# Patient Record
Sex: Female | Born: 1935 | Race: White | Hispanic: No | Smoking: Former smoker
Health system: Southern US, Community
[De-identification: ages and names within clinical notes are randomized; demographics above are authoritative.]

## PROBLEM LIST (undated history)

## (undated) DIAGNOSIS — H547 Unspecified visual loss: Secondary | ICD-10-CM

## (undated) DIAGNOSIS — F32A Depression, unspecified: Secondary | ICD-10-CM

## (undated) DIAGNOSIS — H269 Unspecified cataract: Secondary | ICD-10-CM

## (undated) DIAGNOSIS — F419 Anxiety disorder, unspecified: Secondary | ICD-10-CM

## (undated) DIAGNOSIS — I10 Essential (primary) hypertension: Secondary | ICD-10-CM

## (undated) DIAGNOSIS — J449 Chronic obstructive pulmonary disease, unspecified: Secondary | ICD-10-CM

## (undated) DIAGNOSIS — E785 Hyperlipidemia, unspecified: Secondary | ICD-10-CM

## (undated) DIAGNOSIS — J45909 Unspecified asthma, uncomplicated: Secondary | ICD-10-CM

## (undated) DIAGNOSIS — F0390 Unspecified dementia without behavioral disturbance: Secondary | ICD-10-CM

## (undated) DIAGNOSIS — F329 Major depressive disorder, single episode, unspecified: Secondary | ICD-10-CM

## (undated) DIAGNOSIS — E079 Disorder of thyroid, unspecified: Secondary | ICD-10-CM

## (undated) HISTORY — DX: Unspecified visual loss: H54.7

## (undated) HISTORY — DX: Anxiety disorder, unspecified: F41.9

## (undated) HISTORY — DX: Disorder of thyroid, unspecified: E07.9

## (undated) HISTORY — DX: Unspecified asthma, uncomplicated: J45.909

## (undated) HISTORY — DX: Unspecified cataract: H26.9

## (undated) HISTORY — DX: Major depressive disorder, single episode, unspecified: F32.9

## (undated) HISTORY — DX: Depression, unspecified: F32.A

## (undated) HISTORY — PX: BACK SURGERY: SHX140

---

## 2003-12-21 ENCOUNTER — Ambulatory Visit: Payer: Self-pay

## 2005-01-01 ENCOUNTER — Ambulatory Visit: Payer: Self-pay

## 2006-02-20 ENCOUNTER — Ambulatory Visit: Payer: Self-pay | Admitting: Family Medicine

## 2006-06-06 ENCOUNTER — Ambulatory Visit: Payer: Self-pay | Admitting: General Surgery

## 2007-08-12 ENCOUNTER — Ambulatory Visit: Payer: Self-pay | Admitting: Family Medicine

## 2011-04-30 ENCOUNTER — Ambulatory Visit: Payer: Self-pay | Admitting: Ophthalmology

## 2011-05-07 ENCOUNTER — Ambulatory Visit: Payer: Self-pay | Admitting: Ophthalmology

## 2012-09-29 ENCOUNTER — Ambulatory Visit: Payer: Self-pay | Admitting: Ophthalmology

## 2012-10-06 ENCOUNTER — Ambulatory Visit: Payer: Self-pay | Admitting: Ophthalmology

## 2012-11-03 ENCOUNTER — Ambulatory Visit: Payer: Self-pay

## 2012-12-16 ENCOUNTER — Emergency Department: Payer: Self-pay | Admitting: Emergency Medicine

## 2012-12-16 ENCOUNTER — Ambulatory Visit: Payer: Self-pay | Admitting: Family Medicine

## 2012-12-16 LAB — PROTIME-INR
INR: 1
Prothrombin Time: 13.4 secs (ref 11.5–14.7)

## 2012-12-16 LAB — URINALYSIS, COMPLETE
Bacteria: NONE SEEN
Bilirubin,UR: NEGATIVE
Blood: NEGATIVE
Nitrite: NEGATIVE
RBC,UR: <1 /HPF (ref 0–5)
Squamous Epithelial: NONE SEEN
WBC UR: NONE SEEN /HPF (ref 0–5)

## 2012-12-16 LAB — COMPREHENSIVE METABOLIC PANEL
Albumin: 3.6 g/dL (ref 3.4–5.0)
Alkaline Phosphatase: 85 U/L (ref 50–136)
Anion Gap: 5 — ABNORMAL LOW (ref 7–16)
BUN: 10 mg/dL (ref 7–18)
Bilirubin,Total: 0.4 mg/dL (ref 0.2–1.0)
Chloride: 109 mmol/L — ABNORMAL HIGH (ref 98–107)
Co2: 27 mmol/L (ref 21–32)
Creatinine: 0.8 mg/dL (ref 0.60–1.30)
EGFR (African American): >60
EGFR (Non-African Amer.): >60
Glucose: 104 mg/dL — ABNORMAL HIGH (ref 65–99)
Potassium: 3.3 mmol/L — ABNORMAL LOW (ref 3.5–5.1)
SGOT(AST): 30 U/L (ref 15–37)
Total Protein: 6.8 g/dL (ref 6.4–8.2)

## 2012-12-16 LAB — CBC
HCT: 38.5 % (ref 35.0–47.0)
MCH: 30.2 pg (ref 26.0–34.0)
MCV: 90 fL (ref 80–100)
Platelet: 192 10*3/uL (ref 150–440)
RDW: 13.8 % (ref 11.5–14.5)

## 2012-12-16 LAB — APTT: Activated PTT: 28.3 secs (ref 23.6–35.9)

## 2012-12-16 LAB — CALCIUM: Calcium, Total: 8.9 mg/dL (ref 8.5–10.1)

## 2012-12-16 LAB — MAGNESIUM: Magnesium: 1.9 mg/dL

## 2013-01-20 ENCOUNTER — Ambulatory Visit: Payer: Self-pay | Admitting: Unknown Physician Specialty

## 2013-02-22 ENCOUNTER — Ambulatory Visit: Payer: Self-pay

## 2013-03-14 ENCOUNTER — Emergency Department: Payer: Self-pay | Admitting: Emergency Medicine

## 2013-03-14 LAB — BASIC METABOLIC PANEL
ANION GAP: 3 — AB (ref 7–16)
BUN: 12 mg/dL (ref 7–18)
CHLORIDE: 108 mmol/L — AB (ref 98–107)
CREATININE: 0.81 mg/dL (ref 0.60–1.30)
Calcium, Total: 8.8 mg/dL (ref 8.5–10.1)
Co2: 29 mmol/L (ref 21–32)
EGFR (African American): >60
EGFR (Non-African Amer.): >60
Glucose: 128 mg/dL — ABNORMAL HIGH (ref 65–99)
Osmolality: 281 (ref 275–301)
Potassium: 3.9 mmol/L (ref 3.5–5.1)
Sodium: 140 mmol/L (ref 136–145)

## 2013-03-14 LAB — CBC
HCT: 39 % (ref 35.0–47.0)
HGB: 13 g/dL (ref 12.0–16.0)
MCH: 32.1 pg (ref 26.0–34.0)
MCHC: 33.3 g/dL (ref 32.0–36.0)
MCV: 96 fL (ref 80–100)
Platelet: 185 10*3/uL (ref 150–440)
RBC: 4.06 10*6/uL (ref 3.80–5.20)
RDW: 13.8 % (ref 11.5–14.5)
WBC: 10.7 10*3/uL (ref 3.6–11.0)

## 2013-03-14 LAB — TROPONIN I: Troponin-I: <0.02 ng/mL

## 2013-04-07 ENCOUNTER — Inpatient Hospital Stay: Payer: Self-pay | Admitting: Internal Medicine

## 2013-04-07 LAB — CK TOTAL AND CKMB (NOT AT ARMC)
CK, TOTAL: 225 U/L — AB
CK-MB: 3.3 ng/mL (ref 0.5–3.6)

## 2013-04-07 LAB — COMPREHENSIVE METABOLIC PANEL
ALT: 28 U/L (ref 12–78)
Albumin: 3.9 g/dL (ref 3.4–5.0)
Alkaline Phosphatase: 91 U/L
Anion Gap: 4 — ABNORMAL LOW (ref 7–16)
BUN: 8 mg/dL (ref 7–18)
Bilirubin,Total: 0.4 mg/dL (ref 0.2–1.0)
CALCIUM: 9.2 mg/dL (ref 8.5–10.1)
CHLORIDE: 105 mmol/L (ref 98–107)
Co2: 28 mmol/L (ref 21–32)
Creatinine: 0.94 mg/dL (ref 0.60–1.30)
GFR CALC NON AF AMER: 56 — AB
Glucose: 116 mg/dL — ABNORMAL HIGH (ref 65–99)
OSMOLALITY: 273 (ref 275–301)
Potassium: 3.9 mmol/L (ref 3.5–5.1)
SGOT(AST): 39 U/L — ABNORMAL HIGH (ref 15–37)
Sodium: 137 mmol/L (ref 136–145)
TOTAL PROTEIN: 7.4 g/dL (ref 6.4–8.2)

## 2013-04-07 LAB — TROPONIN I

## 2013-04-07 LAB — CBC
HCT: 39 % (ref 35.0–47.0)
HGB: 13.2 g/dL (ref 12.0–16.0)
MCH: 32.9 pg (ref 26.0–34.0)
MCHC: 33.7 g/dL (ref 32.0–36.0)
MCV: 98 fL (ref 80–100)
Platelet: 228 10*3/uL (ref 150–440)
RBC: 4 10*6/uL (ref 3.80–5.20)
RDW: 13.2 % (ref 11.5–14.5)
WBC: 11.4 10*3/uL — ABNORMAL HIGH (ref 3.6–11.0)

## 2013-04-08 LAB — BASIC METABOLIC PANEL
ANION GAP: 6 — AB (ref 7–16)
BUN: 10 mg/dL (ref 7–18)
CALCIUM: 8.6 mg/dL (ref 8.5–10.1)
CO2: 27 mmol/L (ref 21–32)
CREATININE: 0.98 mg/dL (ref 0.60–1.30)
Chloride: 105 mmol/L (ref 98–107)
EGFR (Non-African Amer.): 53 — ABNORMAL LOW
Glucose: 184 mg/dL — ABNORMAL HIGH (ref 65–99)
OSMOLALITY: 279 (ref 275–301)
POTASSIUM: 4.2 mmol/L (ref 3.5–5.1)
SODIUM: 138 mmol/L (ref 136–145)

## 2013-04-08 LAB — CBC WITH DIFFERENTIAL/PLATELET
BASOS PCT: 0.3 %
Basophil #: 0 10*3/uL (ref 0.0–0.1)
Eosinophil #: 0 10*3/uL (ref 0.0–0.7)
Eosinophil %: 0.1 %
HCT: 34.9 % — AB (ref 35.0–47.0)
HGB: 12.2 g/dL (ref 12.0–16.0)
LYMPHS ABS: 0.4 10*3/uL — AB (ref 1.0–3.6)
Lymphocyte %: 4.8 %
MCH: 34.6 pg — AB (ref 26.0–34.0)
MCHC: 34.9 g/dL (ref 32.0–36.0)
MCV: 99 fL (ref 80–100)
MONOS PCT: 0.7 %
Monocyte #: 0.1 x10 3/mm — ABNORMAL LOW (ref 0.2–0.9)
NEUTROS ABS: 8.2 10*3/uL — AB (ref 1.4–6.5)
Neutrophil %: 94.1 %
PLATELETS: 222 10*3/uL (ref 150–440)
RBC: 3.51 10*6/uL — ABNORMAL LOW (ref 3.80–5.20)
RDW: 13.2 % (ref 11.5–14.5)
WBC: 8.7 10*3/uL (ref 3.6–11.0)

## 2013-04-10 ENCOUNTER — Encounter: Payer: Self-pay | Admitting: Internal Medicine

## 2013-04-19 ENCOUNTER — Encounter: Payer: Self-pay | Admitting: Internal Medicine

## 2014-04-02 ENCOUNTER — Emergency Department: Payer: Self-pay | Admitting: Emergency Medicine

## 2014-04-06 ENCOUNTER — Emergency Department: Payer: Self-pay | Admitting: Emergency Medicine

## 2014-04-29 ENCOUNTER — Emergency Department: Payer: Self-pay | Admitting: Emergency Medicine

## 2014-06-11 NOTE — Op Note (Signed)
PATIENT NAME:  Donna Goodman, Donna Goodman MR#:  [id number] DATE OF BIRTH:  30-Aug-1927  DATE OF PROCEDURE:  10/06/2012  PREOPERATIVE DIAGNOSIS: Cataract, right eye.   POSTOPERATIVE DIAGNOSIS: Cataract, right eye.   PROCEDURE PERFORMED: Extracapsular cataract extraction using phacoemulsification with placement of Alcon SN6CWS, 21.5-diopter posterior chamber lens, serial number [id number].   SURGEON: Maylon PeppersSteven A. Ilhan Madan, M.D.   ANESTHESIA: 4% lidocaine 0.75% Marcaine, a 50-50 mixture with 10 units/mL of HyoMax added given as a peribulbar.   ANESTHESIOLOGIST: Dr. Noralyn Pickarroll.   COMPLICATIONS: None.   ESTIMATED BLOOD LOSS: Less than 1 mL.   DESCRIPTION OF PROCEDURE: The patient was brought to the operating room and given a peribulbar block.  The patient was then prepped and draped in the usual fashion.  The vertical rectus muscles were imbricated using 5-0 silk sutures.  These sutures were then clamped to the sterile drapes as bridle sutures.  A limbal peritomy was performed extending two clock hours and hemostasis was obtained with cautery.  A partial thickness scleral groove was made at the surgical limbus and then dissected anteriorly in a lamellar dissection with using an Alcon crescent knife.  The anterior chamber was entered superonasally with a Superblade and through the lamellar dissection with a 2.6-mm keratome.  DisCoVisc was used to replace the aqueous and a continuous tear capsulorrhexis was carried out.  Hydrodissection and hydrodelineation were carried out with balanced salt and a 27 gauge canula.  The nucleus was rotated to confirm the effectiveness of the hydrodissection.  Phacoemulsification was carried out using a divide-and-conquer technique.  Total ultrasound time was 2 minutes and 43 seconds with an average power of 21.5%. CDE 52.20.  Irrigation/aspiration was used to remove the residual cortex.  DisCoVisc was used to inflate the capsule and the internal wound was enlarged to 3 mm with  the crescent knife.  The intraocular lens was inserted into the capsular bag using the AcrySert delivery system.  Irrigation/aspiration was used to remove the residual DisCoVisc.  Miostat was injected into the anterior chamber through the paracentesis track to inflate the anterior chamber and induce miosis.  A tenth of a mL of cefuroxime was injected through the paracentesis track containing 1 mg of the drug. The wound was checked for leaks and wound leakage was found.  A single 10-0 suture was placed across the incision, tied and the knot was rotated superiorly.  The conjunctiva was closed with cautery and the bridle sutures were removed.  Two drops of 0.3% Vigamox were placed on the eye.  An eye shield was placed on the eye.  The patient was discharged to the recovery room in good condition.   ____________________________ Maylon PeppersSteven A. Juliona Vales, MD sad:aw D: 10/06/2012 14:02:14 ET T: 10/06/2012 15:19:50 ET JOB#: [id number]  cc: Viviann SpareSteven A. Edlin Ford, MD, <Dictator> Erline LevineSTEVEN A Clair Alfieri MD ELECTRONICALLY SIGNED 10/08/2012 15:23

## 2014-06-12 NOTE — H&P (Signed)
PATIENT NAME:  Donna Goodman, Donna Goodman MR#:  [id number] DATE OF BIRTH:  [birth year]  DATE OF ADMISSION:  04/07/2013  REFERRING PHYSICIAN: Dr. Chiquita Loth.   PRIMARY CARE PHYSICIAN: Dr. Judithann Graves.  CHIEF COMPLAINT: Shortness of breath and cough.   HISTORY OF PRESENT ILLNESS: This is an 81 year old female with significant past medical history of chronic obstructive pulmonary disease, not on any home oxygen, history of paroxysmal atrial fibrillation, hypertension, hyperlipidemia, hypothyroidism, who presents with complaints of shortness of breath. Reports her symptoms have been going on for the last week, worsened by exertion, until she had significant wheezing, reports cough but denies any productive sputum. Denies any fever or chills, any chest pain or discomfort. Denies any hemoptysis as well. The patient reports she has been in [place] urgent care recently, where she was given p.o. antibiotic dose, without much improvement of her symptoms. That was why she came here today. The patient was hypoxic, 87% on room air. She does not use any home oxygen. She recently switched to e-cigarettes, quit smoking in May of last year. The patient had significant wheezing, received IV Solu-Medrol and nebulizer treatment. Despite that, she still has some wheezing, but reports significant improvement of her symptoms.   ALLERGIES: Codeine.   PAST MEDICAL HISTORY: 1.  Anxiety.  2.  Chronic obstructive pulmonary disease.  3.  Asthma.  4.  Thyroid disease. 5.  Hyperlipidemia.  6.  Hypertension.  7.  Paroxysmal atrial fibrillation.    SOCIAL HISTORY: The patient lives at home. Quit smoking in May of last year, currently smokes e-cigarettes, has occasional alcohol use.  FAMILY HISTORY: Significant for arthritis and cancer.  HOME MEDICATIONS:  1.  Calcium carbonate 500 mg 2 tablets 3 times a day.  2.  Aspirin 81 mg daily.  3.  Levothyroxine 75 mcg daily.  4.  Simvastatin 40 mg at night.  5.  Sertraline 50 mg oral at night.   6.  Spiriva 18 mcg inhalational daily.  7.  Advair Diskus 150/50, 1 puff b.i.d.  8.  Ventolin as needed.  9.  Cardizem; the patient is unaware of the dose.      REVIEW OF SYSTEMS: CONSTITUTIONAL: The patient denies fever, chills. Complains of fatigue, weakness.  EYES: Denies blurry vision, double vision, inflammation, glaucoma.  ENT: Denies tinnitus, ear pain, epistaxis or discharge.  RESPIRATORY: Complains of cough, wheezing, chronic obstructive pulmonary disease, shortness of breath. Denies hemoptysis or productive sputum.  CARDIOVASCULAR: Denies chest pain, edema, palpitations, syncope.  GASTROINTESTINAL: Denies nausea, vomiting, diarrhea, abdominal pain, hematemesis, or GERD.  GENITOURINARY: Denies dysuria, hematuria, or renal colic.  ENDOCRINE: Denies polyuria, polydipsia, heat or cold intolerance.  HEMATOLOGY: Denies anemia, easy bruising, bleeding diathesis.  INTEGUMENTARY: Denies acne, rash or skin lesion.  MUSCULOSKELETAL: Denies any swelling, gout, arthritis or cramps.  NEUROLOGIC: Denies CVA, transient ischemic attack, vertigo or headaches.  PSYCHIATRIC: Denies insomnia or depression. Has history of anxiety.   PHYSICAL EXAMINATION: VITAL SIGNS: Temperature 97.9, pulse 96, respiratory rate 22, blood pressure 150/67, saturating  on oxygen, was 87 on room air.  GENERAL: Elderly female who looks comfortable in bed, in no apparent distress.  HEENT: Head atraumatic, normocephalic. Pupils equal, reactive to light. Pink conjunctivae. Anicteric sclerae. Moist oral mucosa.  NECK: Supple. No thyromegaly. No JVD.  CHEST: Had fair air entry bilaterally with diffuse wheezing. No rales or rhonchi.  CARDIOVASCULAR: S1, S2 heard. No rubs, murmur or gallops.  ABDOMEN: Soft, nontender, nondistended. Bowel sounds present.  EXTREMITIES: Mild pitting edema around the ankles. No  clubbing. No cyanosis. Pedal pulses felt bilaterally.  PSYCHIATRIC: Appropriate affect. Awake, alert x3. Intact judgment  and insight.  NEUROLOGIC: Cranial nerves grossly intact. Motor 5/5. No focal deficits.  SKIN: Normal skin turgor. Warm and dry.  LYMPHATIC: No cervical lymphadenopathy could be appreciated.   PERTINENT LABORATORY DATA: Glucose is 116, BUN 8, creatinine 0.94, sodium 137, potassium 3.9, chloride 105, ALT 28, AST 39, alkaline phosphatase 91. Troponin less than 0.02. White blood cells 11.4, hemoglobin 13.2, hematocrit 39, platelet 228. Chest x-ray showing no acute cardiopulmonary abnormality seen.   ASSESSMENT AND PLAN: 1.  Chronic obstructive pulmonary disease exacerbation. The patient as well presents with hypoxic respiratory failure. The patient will be kept on oxygen as needed to keep oxygen saturation around 94. She will be started on IV Solu-Medrol, DuoNebs and as-needed albuterol. We will resume her home Advair and Spiriva. She has no productive cough, so at this point, there is no indication for antibiotics.  2.  History of paroxysmal atrial fibrillation. The patient currently in normal sinus rhythm. She is on aspirin; unclear what is her home Cardizem dose, so will start her on a lower Cardizem dose until we have her medication dosage.  3.  Hypertension, on the higher side. Continue with Cardizem.  4.  Hyperlipidemia. Continue with statin.  5.  Hypothyroidism. Continue with Synthroid.  6.  Gastrointestinal prophylaxis. Will start on Protonix as she is on large dose of IV steroids. 7.  DVT prophylaxis. Continue with subcutaneous heparin.   CODE STATUS: Discussed with the patient and her brother. Brother is her power of attorney. She has a living will. The patient is do not resuscitate.   TOTAL TIME SPENT ON ADMISSION AND PATIENT CARE: 55 minutes.    ____________________________ Starleen Armsawood S. Deolinda Frid, MD dse:cg D: 04/07/2013 23:47:51 ET T: 04/08/2013 01:33:49 ET JOB#: [id number]  cc: Starleen Armsawood S. Breonia Kirstein, MD, <Dictator> Teryn Gust Teena IraniS Orvetta Danielski MD ELECTRONICALLY SIGNED 04/16/2013 0:46

## 2014-06-12 NOTE — Discharge Summary (Signed)
PATIENT NAME:  Donna Goodman, Donna Goodman MR#:  [id number] DATE OF BIRTH:  1927-04-06  DATE OF ADMISSION:  04/07/2013 DATE OF DISCHARGE:  04/10/2013  PRIMARY CARE PHYSICIAN: Dr. Judithann Goodman.   CHIEF COMPLAINT: Shortness of breath and cough.   DISCHARGE DIAGNOSES: 1.  Acute respiratory failure due to acute chronic obstructive pulmonary disease exacerbation.  2.  History of paroxysmal atrial fibrillation.  3.  Hypertension.  4.  Hyperlipidemia.  5.  Hypothyroidism.  6.  History of chronic obstructive pulmonary disease.   DISCHARGE MEDICATIONS: Diltiazem 120 mg extended-release once a day, sertraline 50 mg daily, levothyroxine 75 mcg daily, Spiriva 18 mcg inhaled 1 cap daily, Ventolin 1 puff 4 times a day as needed, simvastatin 40 mg once a day, omeprazole 20 mg once a day, Advair 100/50 mcg 1 puff 2 times a day, tramadol 50 mg every 4 hours as needed, aspirin 81 mg daily, prednisone taper 50 mg once a day and then taper by 10 mg until done in 5 days.   DISCHARGE INSTRUCTIONS: She will be going to rehab with 2 liters of oxygen via nasal cannula.   DIET: Low-sodium.   ACTIVITY: As tolerated.   FOLLOWUP: Please follow with PCP within 1 to 2 weeks.   DISPOSITION: To rehab.  CODE STATUS:  The patient is DNR.  SIGNIFICANT LABORATORIES AND IMAGING: Initial troponin negative. BUN 8, creatinine 0.94. Sodium 137, potassium 3.9. White count of 11.4, hemoglobin 13.2, platelets 228.   X-ray of the chest, one view, showing no acute cardiopulmonary abnormalities.  HISTORY OF PRESENT ILLNESS AND HOSPITAL COURSE:  For full details of H and P, please see the dictation on February 17 by Dr. Randol Goodman. But briefly, this is a pleasant 81 year old female with a history of COPD, not on any home oxygen who apparently is noncompliant with inhalers. Came in with shortness of breath, wheezing, without any productive sputum and was noted to have sats in the high 80s on room air and was admitted to the hospitalist service with  IV steroids, nebs, oxygen and her inhalers. She likely had acute hypoxic respiratory failure secondary to acute COPD exacerbation and noncompliant with meds. She was seen by physical therapy and the recommendation was to be discharged to rehab and [facility] bed [facility]. She will be discharged on prednisone taper and oxygen for now and hopefully we can wean her off of the oxygen. In terms of her wheezing, has resolved. Her breathing is significantly better. She does have a history of paroxysmal atrial fibrillation, rate was controlled here, and she is on Cardizem. We have continued her Synthroid and her statin for her hypothyroidism and hyperlipidemia.   PHYSICAL EXAMINATION: VITAL SIGNS: On the day of discharge, temperature is 97.5, pulse rate 90, respiratory rate 18, blood pressure 157/72, O2 sat 96% on 1-1/2 liters.  GENERAL: The patient is a well-developed Caucasian female sitting in bed, pleasant, cooperative.  HEENT: Normocephalic, atraumatic.  LUNGS: Clear to auscultation without significant wheezing or rhonchi.  ABDOMEN: Soft, nontender. CARDIAC:  The patient has normal S1, S2 without significant murmurs and no pitting edema.  The patient is DNR.  TOTAL TIME SPENT:  40 minutes   ____________________________ Donna EatonShayiq Yaelis Scharfenberg, MD sa:ce D: 04/10/2013 12:14:23 ET T: 04/10/2013 12:35:08 ET JOB#: [id number]  cc: Donna EatonShayiq Shirley Bolle, MD, <Dictator> Donna EdwardLaura Berglund, MD Donna EatonSHAYIQ Alaiah Lundy MD ELECTRONICALLY SIGNED 04/24/2013 13:09

## 2014-06-13 NOTE — Op Note (Signed)
PATIENT NAME:  Donna Goodman, Donna Goodman MR#:  [id number] DATE OF BIRTH:  Nov 18, 1927  DATE OF PROCEDURE:  05/07/2011  PREOPERATIVE DIAGNOSIS:  Cataract, left eye.    POSTOPERATIVE DIAGNOSIS:  Cataract, left eye.  PROCEDURE PERFORMED:  Extracapsular cataract extraction using phacoemulsification with placement of an Alcon SN6CWS, 20.5-diopter posterior chamber lens, serial # [id number].  SURGEON:  Maylon PeppersSteven A. Cathrine Krizan, MD  ASSISTANT:  None.  ANESTHESIA:  4% lidocaine and 0.75% Marcaine in a 50/50 mixture with 10 units/mL of Vitrase added, given as a peribulbar.  ANESTHESIOLOGIST:  Dr. Noralyn Pickarroll.   COMPLICATIONS:  None.  ESTIMATED BLOOD LOSS:  Less than 1 mL.  DESCRIPTION OF PROCEDURE:  The patient was brought to the operating room and given a peribulbar block.  The patient was then prepped and draped in the usual fashion.  The vertical rectus muscles were imbricated using 5-0 silk sutures.  These sutures were then clamped to the sterile drapes as bridle sutures.  A limbal peritomy was performed extending two clock hours and hemostasis was obtained with cautery.  A partial thickness scleral groove was made at the surgical limbus and dissected anteriorly in a lamellar dissection using an Alcon crescent knife.  The anterior chamber was entered supero-temporally with a Superblade and through the lamellar dissection with a 2.6 mm keratome.  DisCoVisc was used to replace the aqueous and a continuous tear capsulorrhexis was carried out.  Hydrodissection and hydrodelineation were carried out with balanced salt and a 27 gauge canula.  The nucleus was rotated to confirm the effectiveness of the hydrodissection.  Phacoemulsification was carried out using a divide-and-conquer technique.  Total ultrasound time was 2 minutes and 19 seconds with an average power of 19.1 percent. CDE 38.43.  Irrigation/aspiration was used to remove the residual cortex.  DisCoVisc was used to inflate the capsule and the internal  incision was enlarged to 3 mm with the crescent knife.  The intraocular lens was folded and inserted into the capsular bag using the AcrySert Delivery System.  Irrigation/aspiration was used to remove the residual DisCoVisc.  Miostat was injected into the anterior chamber through the paracentesis track to inflate the anterior chamber and induce miosis.  The wound was checked for leaks and none were found. The conjunctiva was closed with cautery and the bridle sutures were removed.  Two drops of 0.3% Vigamox were placed on the eye.   An eye shield was placed on the eye.  The patient was discharged to the recovery room in good condition.  ____________________________ Maylon PeppersSteven A. Kari Montero, MD sad:cms D: 05/07/2011 13:27:47 ET T: 05/07/2011 13:54:42 ET JOB#: [id number]  cc: Viviann SpareSteven A. Makhayla Mcmurry, MD, <Dictator>  Erline LevineSTEVEN A Mineola Duan MD ELECTRONICALLY SIGNED 05/09/2011 15:17

## 2014-08-24 ENCOUNTER — Emergency Department: Payer: Medicare Other

## 2014-08-24 ENCOUNTER — Emergency Department
Admission: EM | Admit: 2014-08-24 | Discharge: 2014-08-24 | Disposition: A | Discharge status: Home / Self Care | Payer: Medicare Other | Attending: Emergency Medicine | Admitting: Emergency Medicine

## 2014-08-24 ENCOUNTER — Encounter: Payer: Self-pay | Admitting: Emergency Medicine

## 2014-08-24 DIAGNOSIS — S79911A Unspecified injury of right hip, initial encounter: Secondary | ICD-10-CM | POA: Diagnosis present

## 2014-08-24 DIAGNOSIS — Y92129 Unspecified place in nursing home as the place of occurrence of the external cause: Secondary | ICD-10-CM | POA: Insufficient documentation

## 2014-08-24 DIAGNOSIS — W01198A Fall on same level from slipping, tripping and stumbling with subsequent striking against other object, initial encounter: Secondary | ICD-10-CM | POA: Insufficient documentation

## 2014-08-24 DIAGNOSIS — S7001XA Contusion of right hip, initial encounter: Secondary | ICD-10-CM | POA: Diagnosis not present

## 2014-08-24 DIAGNOSIS — F0390 Unspecified dementia without behavioral disturbance: Secondary | ICD-10-CM | POA: Diagnosis not present

## 2014-08-24 DIAGNOSIS — Y9389 Activity, other specified: Secondary | ICD-10-CM | POA: Insufficient documentation

## 2014-08-24 DIAGNOSIS — Z87891 Personal history of nicotine dependence: Secondary | ICD-10-CM | POA: Diagnosis not present

## 2014-08-24 DIAGNOSIS — Y998 Other external cause status: Secondary | ICD-10-CM | POA: Diagnosis not present

## 2014-08-24 DIAGNOSIS — I10 Essential (primary) hypertension: Secondary | ICD-10-CM | POA: Diagnosis not present

## 2014-08-24 DIAGNOSIS — S7011XA Contusion of right thigh, initial encounter: Secondary | ICD-10-CM | POA: Diagnosis not present

## 2014-08-24 HISTORY — DX: Unspecified asthma, uncomplicated: J45.909

## 2014-08-24 HISTORY — DX: Chronic obstructive pulmonary disease, unspecified: J44.9

## 2014-08-24 HISTORY — DX: Essential (primary) hypertension: I10

## 2014-08-24 HISTORY — DX: Hyperlipidemia, unspecified: E78.5

## 2014-08-24 HISTORY — DX: Unspecified dementia, unspecified severity, without behavioral disturbance, psychotic disturbance, mood disturbance, and anxiety: F03.90

## 2014-08-24 MED ORDER — TRAMADOL HCL 50 MG PO TABS
ORAL_TABLET | ORAL | Status: AC
  Administered 2014-08-24: 50 mg via ORAL
  Filled 2014-08-24: qty 1

## 2014-08-24 MED ORDER — TRAMADOL HCL 50 MG PO TABS
50.0000 mg | ORAL_TABLET | Freq: Once | ORAL | Status: AC
  Administered 2014-08-24: 50 mg via ORAL

## 2014-08-24 NOTE — ED Notes (Signed)
Patient transported to X-ray 

## 2014-08-24 NOTE — Discharge Instructions (Signed)
Contusion °A contusion is a deep bruise. Contusions happen when an injury causes bleeding under the skin. Signs of bruising include pain, puffiness (swelling), and discolored skin. The contusion may turn blue, purple, or yellow. °HOME CARE  °· Put ice on the injured area. °¨ Put ice in a plastic bag. °¨ Place a towel between your skin and the bag. °¨ Leave the ice on for 15-20 minutes, 03-04 times a day. °· Only take medicine as told by your doctor. °· Rest the injured area. °· If possible, raise (elevate) the injured area to lessen puffiness. °GET HELP RIGHT AWAY IF:  °· You have more bruising or puffiness. °· You have pain that is getting worse. °· Your puffiness or pain is not helped by medicine. °MAKE SURE YOU:  °· Understand these instructions. °· Will watch your condition. °· Will get help right away if you are not doing well or get worse. °Document Released: 07/25/2007 Document Revised: 04/30/2011 Document Reviewed: 12/11/2010 °[employer]® Patient Information ©2015 [employer]. This information is not intended to replace advice given to you by your health care provider. Make sure you discuss any questions you have with your health care provider. ° °

## 2014-08-24 NOTE — ED Provider Notes (Signed)
[facility] Emergency Department Provider Note  ____________________________________________  Time seen: Approximately 1:50 PM  I have reviewed the triage vital signs and the nursing notes.   HISTORY  Chief Complaint Fall    HPI Donna Goodman is a 81 y.o. female [facility] for right hip pain secondary to a fall. [facility], slipped and fell on the wet on wet cement, landing on right buttock. State pain is sharp in the right buttocks and rates it as a 10 over 10.  Past Medical History  Diagnosis Date  . Asthma   . Hypertension   . COPD (chronic obstructive pulmonary disease)   . Dementia   . Hyperlipemia     There are no active problems to display for this patient.   Past Surgical History  Procedure Laterality Date  . Back surgery      No current outpatient prescriptions on file.  Allergies Review of patient's allergies indicates no known allergies.  No family history on file.  Social History History  Substance Use Topics  . Smoking status: Former Games developer  . Smokeless tobacco: Not on file  . Alcohol Use: Not on file    Review of Systems Constitutional: No fever/chills Eyes: No visual changes. ENT: No sore throat. Cardiovascular: Denies chest pain. Respiratory: Denies shortness of breath. Gastrointestinal: No abdominal pain.  No nausea, no vomiting.  No diarrhea.  No constipation. Genitourinary: Negative for dysuria. Musculoskeletal: Negative for back pain. Skin: Negative for rash. Neurological: Negative for headaches, focal weakness or numbness. Psychiatric: Dementia Endocrine:Negative edema and hypertension. Hematological/Lymphatic: 10-point ROS otherwise negative.  ____________________________________________   PHYSICAL EXAM:  VITAL SIGNS: ED Triage Vitals  Enc Vitals Group     BP 08/24/14 1336 146/90 mmHg     Pulse Rate 08/24/14 1336 68     Resp 08/24/14  1336 18     Temp 08/24/14 1336 97.9 F (36.6 C)     Temp Source 08/24/14 1336 Oral     SpO2 08/24/14 1336 98 %     Weight 08/24/14 1336 120 lb (54.432 kg)     Height 08/24/14 1336  (1.6 m)     Head Cir --      Peak Flow --      Pain Score 08/24/14 1337 10     Pain Loc --      Pain Edu? --      Excl. in GC? --     Constitutional: Alert and oriented. Well appearing and in no acute distress. Eyes: Conjunctivae are normal. PERRL. EOMI. Head: Atraumatic. Nose: No congestion/rhinnorhea. Mouth/Throat: Mucous membranes are moist.  Oropharynx non-erythematous. Neck: No stridor.  No cervical spine tenderness to palpation. *Hematological/Lymphatic/Immunilogical: No cervical lymphadenopathy. Cardiovascular: Normal rate, regular rhythm. Grossly normal heart sounds.  Good peripheral circulation. Respiratory: Normal respiratory effort.  No retractions. Lungs CTAB. Gastrointestinal: Soft and nontender. No distention. No abdominal bruits. No CVA tenderness. Musculoskeletal: Obvious deformity of the right hip no leg length discrepancy. Lower extremities neurovascularly intact decreased range of motion abduction of the hip secondary to complain of pain. Neurologic:  Normal speech and language. No gross focal neurologic deficits are appreciated. Speech is normal. No gait instability. Skin:  Skin is warm, dry and intact. No rash noted. No ecchymosis visible Psychiatric: Mood and affect are normal. Speech and behavior are normal.  ____________________________________________   LABS (all labs ordered are listed, but only abnormal results are displayed)  Labs Reviewed -  No data to display ____________________________________________  EKG   ____________________________________________  RADIOLOGY  No acute findings.  I, Joni Reiningonald K Ronit Cranfield, personally viewed and evaluated these images as part of my medical decision making.    ____________________________________________   PROCEDURES  Procedure(s) performed: None  Critical Care performed: No  ____________________________________________   INITIAL IMPRESSION / ASSESSMENT AND PLAN / ED COURSE  Pertinent labs & imaging results that were available during my care of the patient were reviewed by me and considered in my medical decision making (see chart for details).  Contusion to the right buttocks and hip secondary to fall. Discussed x-ray findings with patient and her brother who is the power of attorney. Discussed sequela of this fall. Advised supportive care and follow-up family doctor. ____________________________________________   FINAL CLINICAL IMPRESSION(S) / ED DIAGNOSES  Final diagnoses:  Contusion of right hip and thigh, initial encounter      Joni ReiningRonald K Terriyah Westra, PA-C 08/24/14 1443  Loleta Roseory Forbach, MD 08/28/14 1500

## 2014-08-24 NOTE — ED Notes (Addendum)
[facility] , was [place] the [place] center and fell on the wet cement, landing on right buttock, no report of head trauma , no LOC , denies headache. Pt complaining of right buttock/ right hip pain , no rotation or shortening noted,

## 2014-08-24 NOTE — ED Notes (Signed)
Report called to [facility] 

## 2015-01-21 ENCOUNTER — Emergency Department
Admission: EM | Admit: 2015-01-21 | Discharge: 2015-01-21 | Disposition: A | Discharge status: Home / Self Care | Payer: Medicare Other | Attending: Emergency Medicine | Admitting: Emergency Medicine

## 2015-01-21 ENCOUNTER — Emergency Department: Payer: Medicare Other

## 2015-01-21 ENCOUNTER — Encounter: Payer: Self-pay | Admitting: Emergency Medicine

## 2015-01-21 DIAGNOSIS — Y9389 Activity, other specified: Secondary | ICD-10-CM | POA: Diagnosis not present

## 2015-01-21 DIAGNOSIS — T148 Other injury of unspecified body region: Secondary | ICD-10-CM | POA: Diagnosis not present

## 2015-01-21 DIAGNOSIS — Y998 Other external cause status: Secondary | ICD-10-CM | POA: Diagnosis not present

## 2015-01-21 DIAGNOSIS — S79911A Unspecified injury of right hip, initial encounter: Secondary | ICD-10-CM | POA: Diagnosis present

## 2015-01-21 DIAGNOSIS — I10 Essential (primary) hypertension: Secondary | ICD-10-CM | POA: Insufficient documentation

## 2015-01-21 DIAGNOSIS — R52 Pain, unspecified: Secondary | ICD-10-CM

## 2015-01-21 DIAGNOSIS — T148XXA Other injury of unspecified body region, initial encounter: Secondary | ICD-10-CM

## 2015-01-21 DIAGNOSIS — S3992XA Unspecified injury of lower back, initial encounter: Secondary | ICD-10-CM | POA: Diagnosis not present

## 2015-01-21 DIAGNOSIS — Y9289 Other specified places as the place of occurrence of the external cause: Secondary | ICD-10-CM | POA: Insufficient documentation

## 2015-01-21 DIAGNOSIS — W19XXXA Unspecified fall, initial encounter: Secondary | ICD-10-CM

## 2015-01-21 DIAGNOSIS — Z87891 Personal history of nicotine dependence: Secondary | ICD-10-CM | POA: Diagnosis not present

## 2015-01-21 DIAGNOSIS — W01198A Fall on same level from slipping, tripping and stumbling with subsequent striking against other object, initial encounter: Secondary | ICD-10-CM | POA: Insufficient documentation

## 2015-01-21 NOTE — ED Provider Notes (Signed)
[facility] Emergency Department Provider Note    ____________________________________________  Time seen: 1935  I have reviewed the triage vital signs and the nursing notes.   HISTORY  Chief Complaint Fall   History limited by: Not Limited   HPI Donna Goodman is a 81 y.o. female who presents to the emergency department today after fall. The patient states that she was reaching to open a door when her hand came off the knob and she fell backwards. She had the back of her head against a piece of furniture and then hit her buttucks on the floor. She denies blacking out. She denies any chest pain or shortness of breath around the time of the fall. Currently her main complaint is right hip pain. She states it hurts right where her bones are. She states she feels like she has a knot there. This started after the fall. Has been constant. The pain is mild.   Past Medical History  Diagnosis Date  . Asthma   . Hypertension   . COPD (chronic obstructive pulmonary disease) (HCC)   . Dementia   . Hyperlipemia     There are no active problems to display for this patient.   Past Surgical History  Procedure Laterality Date  . Back surgery      No current outpatient prescriptions on file.  Allergies Codeine  No family history on file.  Social History Social History  Substance Use Topics  . Smoking status: Former Games developer  . Smokeless tobacco: None  . Alcohol Use: No    Review of Systems  Constitutional: Negative for fever. Cardiovascular: Negative for chest pain. Respiratory: Negative for shortness of breath. Gastrointestinal: Negative for abdominal pain, vomiting and diarrhea. Genitourinary: Negative for dysuria. Musculoskeletal: Negative for back pain. Positive for right buttock pain. Skin: Negative for rash. Neurological: Negative for headaches, focal weakness or numbness.  10-point ROS otherwise  negative.  ____________________________________________   PHYSICAL EXAM:  VITAL SIGNS: ED Triage Vitals  Enc Vitals Group     BP 01/21/15 1816 146/72 mmHg     Pulse Rate 01/21/15 1819 60     Resp 01/21/15 1819 16     Temp 01/21/15 1819 98.7 F (37.1 C)     Temp Source 01/21/15 1816 Oral     SpO2 01/21/15 1819 98 %     Weight 01/21/15 1819 120 lb (54.432 kg)     Height 01/21/15 1819  (1.626 m)     Head Cir --      Peak Flow --      Pain Score 01/21/15 1830 0   Constitutional: Alert and oriented. Well appearing and in no distress. Eyes: Conjunctivae are normal. PERRL. Normal extraocular movements. ENT   Head: Normocephalic and atraumatic.   Nose: No congestion/rhinnorhea.   Mouth/Throat: Mucous membranes are moist.   Neck: No stridor. No midline tenderness Hematological/Lymphatic/Immunilogical: No cervical lymphadenopathy. Cardiovascular: Normal rate, regular rhythm.  No murmurs, rubs, or gallops. Respiratory: Normal respiratory effort without tachypnea nor retractions. Breath sounds are clear and equal bilaterally. No wheezes/rales/rhonchi. Gastrointestinal: Soft and nontender. No distention. There is no CVA tenderness. Genitourinary: Deferred Musculoskeletal: Normal range of motion in all extremities. No joint effusions.  No lower extremity tenderness nor edema. Pelvis stable. No tenderness to internal/external rotation at the hips. No tenderness to flexion of the hips. No spinal tenderness. Neurologic:  Normal speech and language. No gross focal neurologic deficits are appreciated.  Skin:  Skin is warm, dry and intact. No rash  noted. Psychiatric: Mood and affect are normal. Speech and behavior are normal. Patient exhibits appropriate insight and judgment.  ____________________________________________    LABS (pertinent  positives/negatives)  None  ____________________________________________   EKG  None  ____________________________________________    RADIOLOGY  CT head/cervical spine IMPRESSION: 1. No acute intracranial pathology. 2. No acute osseous injury of the cervical spine. 3. Cervical spine spondylosis as described above. 4. Lucent lesions in the dens which are similar in appearance to the prior exam of 12/16/2012 likely reflecting intraosseous cysts and possible erosion. There is a lucent lesion in the lateral mass of C1. This may be secondary to an inflammatory or crystalline arthropathy and last likely malignancy such as myeloma given that there is no significant interval change compared with 12/16/2012.  ____________________________________________   PROCEDURES  Procedure(s) performed: None  Critical Care performed: No  ____________________________________________   INITIAL IMPRESSION / ASSESSMENT AND PLAN / ED COURSE  Pertinent labs & imaging results that were available during my care of the patient were reviewed by me and considered in my medical decision making (see chart for details).  Patient presented to the emergency department today after a mechanical fall. CT head cervical spine and right hip all negative. Vital signs stable. Will discharge home.  ____________________________________________   FINAL CLINICAL IMPRESSION(S) / ED DIAGNOSES  Final diagnoses:  Charlann BoxerFall     Yehudis Monceaux, MD 01/21/15 2054

## 2015-01-21 NOTE — Discharge Instructions (Signed)
Please seek medical attention for any high fevers, chest pain, shortness of breath, change in behavior, persistent vomiting, bloody stool or any other new or concerning symptoms.   Musculoskeletal Pain Musculoskeletal pain is muscle and boney aches and pains. These pains can occur in any part of the body. Your caregiver may treat you without knowing the cause of the pain. They may treat you if blood or urine tests, X-rays, and other tests were normal.  CAUSES There is often not a definite cause or reason for these pains. These pains may be caused by a type of germ (virus). The discomfort may also come from overuse. Overuse includes working out too hard when your body is not fit. Boney aches also come from weather changes. Bone is sensitive to atmospheric pressure changes. HOME CARE INSTRUCTIONS   Ask when your test results will be ready. Make sure you get your test results.  Only take over-the-counter or prescription medicines for pain, discomfort, or fever as directed by your caregiver. If you were given medications for your condition, do not drive, operate machinery or power tools, or sign legal documents for 24 hours. Do not drink alcohol. Do not take sleeping pills or other medications that may interfere with treatment.  Continue all activities unless the activities cause more pain. When the pain lessens, slowly resume normal activities. Gradually increase the intensity and duration of the activities or exercise.  During periods of severe pain, bed rest may be helpful. Lay or sit in any position that is comfortable.  Putting ice on the injured area.  Put ice in a bag.  Place a towel between your skin and the bag.  Leave the ice on for 15 to 20 minutes, 3 to 4 times a day.  Follow up with your caregiver for continued problems and no reason can be found for the pain. If the pain becomes worse or does not go away, it may be necessary to repeat tests or do additional testing. Your caregiver  may need to look further for a possible cause. SEEK IMMEDIATE MEDICAL CARE IF:  You have pain that is getting worse and is not relieved by medications.  You develop chest pain that is associated with shortness or breath, sweating, feeling sick to your stomach (nauseous), or throw up (vomit).  Your pain becomes localized to the abdomen.  You develop any new symptoms that seem different or that concern you. MAKE SURE YOU:   Understand these instructions.  Will watch your condition.  Will get help right away if you are not doing well or get worse.   This information is not intended to replace advice given to you by your health care provider. Make sure you discuss any questions you have with your health care provider.   Document Released: 02/05/2005 Document Revised: 04/30/2011 Document Reviewed: 10/10/2012 Elsevier Interactive Patient Education 2016 [employer]. Fall Prevention in the Home  Falls can cause injuries and can affect people from all age groups. There are many simple things that you can do to make your home safe and to help prevent falls. WHAT CAN I DO ON THE OUTSIDE OF MY HOME?  Regularly repair the edges of walkways and driveways and fix any cracks.  Remove high doorway thresholds.  Trim any shrubbery on the main path into your home.  Use bright outdoor lighting.  Clear walkways of debris and clutter, including tools and rocks.  Regularly check that handrails are securely fastened and in good repair. Both sides of any steps  should have handrails.  Install guardrails along the edges of any raised decks or porches.  Have leaves, snow, and ice cleared regularly.  Use sand or salt on walkways during winter months.  In the garage, clean up any spills right away, including grease or oil spills. WHAT CAN I DO IN THE BATHROOM?  Use night lights.  Install grab bars by the toilet and in the tub and shower. Do not use towel bars as grab bars.  Use non-skid mats or  decals on the floor of the tub or shower.  If you need to sit down while you are in the shower, use a plastic, non-slip stool.Marland Kitchen  Keep the floor dry. Immediately clean up any water that spills on the floor.  Remove soap buildup in the tub or shower on a regular basis.  Attach bath mats securely with double-sided non-slip rug tape.  Remove throw rugs and other tripping hazards from the floor. WHAT CAN I DO IN THE BEDROOM?  Use night lights.  Make sure that a bedside light is easy to reach.  Do not use oversized bedding that drapes onto the floor.  Have a firm chair that has side arms to use for getting dressed.  Remove throw rugs and other tripping hazards from the floor. WHAT CAN I DO IN THE KITCHEN?   Clean up any spills right away.  Avoid walking on wet floors.  Place frequently used items in easy-to-reach places.  If you need to reach for something above you, use a sturdy step stool that has a grab bar.  Keep electrical cables out of the way.  Do not use floor polish or wax that makes floors slippery. If you have to use wax, make sure that it is non-skid floor wax.  Remove throw rugs and other tripping hazards from the floor. WHAT CAN I DO IN THE STAIRWAYS?  Do not leave any items on the stairs.  Make sure that there are handrails on both sides of the stairs. Fix handrails that are broken or loose. Make sure that handrails are as long as the stairways.  Check any carpeting to make sure that it is firmly attached to the stairs. Fix any carpet that is loose or worn.  Avoid having throw rugs at the top or bottom of stairways, or secure the rugs with carpet tape to prevent them from moving.  Make sure that you have a light switch at the top of the stairs and the bottom of the stairs. If you do not have them, have them installed. WHAT ARE SOME OTHER FALL PREVENTION TIPS?  Wear closed-toe shoes that fit well and support your feet. Wear shoes that have rubber soles or low  heels.  When you use a stepladder, make sure that it is completely opened and that the sides are firmly locked. Have someone hold the ladder while you are using it. Do not climb a closed stepladder.  Add color or contrast paint or tape to grab bars and handrails in your home. Place contrasting color strips on the first and last steps.  Use mobility aids as needed, such as canes, walkers, scooters, and crutches.  Turn on lights if it is dark. Replace any light bulbs that burn out.  Set up furniture so that there are clear paths. Keep the furniture in the same spot.  Fix any uneven floor surfaces.  Choose a carpet design that does not hide the edge of steps of a stairway.  Be aware of any and  all pets.  Review your medicines with your healthcare provider. Some medicines can cause dizziness or changes in blood pressure, which increase your risk of falling. Talk with your health care provider about other ways that you can decrease your risk of falls. This may include working with a physical therapist or trainer to improve your strength, balance, and endurance.   This information is not intended to replace advice given to you by your health care provider. Make sure you discuss any questions you have with your health care provider.   Document Released: 01/26/2002 Document Revised: 06/22/2014 Document Reviewed: 03/12/2014 Elsevier Interactive Patient Education [employer]2016 Elsevier Inc.

## 2015-01-21 NOTE — ED Notes (Signed)
Patient transported to X-ray 

## 2015-01-21 NOTE — ED Notes (Signed)
Pt ambulated to bathroom with assist x1, tolerated well, no acute distress noted.

## 2015-01-21 NOTE — ED Notes (Addendum)
Pt comes from  house for fall hitting back of head, no loss of LOC , A&O, denies any pain. Pt has no lacerations, not taking any blood thinners At this time. Pt comes with DNR, placed on chart at this time

## 2015-01-21 NOTE — ED Notes (Signed)
Patient transported to CT 

## 2015-12-03 ENCOUNTER — Encounter: Payer: Self-pay | Admitting: *Deleted

## 2015-12-03 ENCOUNTER — Emergency Department: Payer: Medicare Other

## 2015-12-03 ENCOUNTER — Emergency Department
Admission: EM | Admit: 2015-12-03 | Discharge: 2015-12-03 | Disposition: A | Discharge status: Home / Self Care | Payer: Medicare Other | Attending: Student | Admitting: Student

## 2015-12-03 DIAGNOSIS — I10 Essential (primary) hypertension: Secondary | ICD-10-CM | POA: Insufficient documentation

## 2015-12-03 DIAGNOSIS — J45909 Unspecified asthma, uncomplicated: Secondary | ICD-10-CM | POA: Diagnosis not present

## 2015-12-03 DIAGNOSIS — W19XXXA Unspecified fall, initial encounter: Secondary | ICD-10-CM | POA: Insufficient documentation

## 2015-12-03 DIAGNOSIS — Y9289 Other specified places as the place of occurrence of the external cause: Secondary | ICD-10-CM | POA: Insufficient documentation

## 2015-12-03 DIAGNOSIS — Z87891 Personal history of nicotine dependence: Secondary | ICD-10-CM | POA: Insufficient documentation

## 2015-12-03 DIAGNOSIS — Y999 Unspecified external cause status: Secondary | ICD-10-CM | POA: Insufficient documentation

## 2015-12-03 DIAGNOSIS — Z7982 Long term (current) use of aspirin: Secondary | ICD-10-CM | POA: Diagnosis not present

## 2015-12-03 DIAGNOSIS — J449 Chronic obstructive pulmonary disease, unspecified: Secondary | ICD-10-CM | POA: Diagnosis not present

## 2015-12-03 DIAGNOSIS — Y939 Activity, unspecified: Secondary | ICD-10-CM | POA: Diagnosis not present

## 2015-12-03 DIAGNOSIS — Z79899 Other long term (current) drug therapy: Secondary | ICD-10-CM | POA: Diagnosis not present

## 2015-12-03 DIAGNOSIS — R531 Weakness: Secondary | ICD-10-CM | POA: Insufficient documentation

## 2015-12-03 DIAGNOSIS — F0390 Unspecified dementia without behavioral disturbance: Secondary | ICD-10-CM | POA: Insufficient documentation

## 2015-12-03 LAB — CBC WITH DIFFERENTIAL/PLATELET
BASOS PCT: 1 %
Basophils Absolute: 0.1 10*3/uL (ref 0–0.1)
Eosinophils Absolute: 0.3 10*3/uL (ref 0–0.7)
Eosinophils Relative: 3 %
HEMATOCRIT: 43.1 % (ref 35.0–47.0)
HEMOGLOBIN: 14.2 g/dL (ref 12.0–16.0)
LYMPHS ABS: 3.4 10*3/uL (ref 1.0–3.6)
LYMPHS PCT: 42 %
MCH: 31.5 pg (ref 26.0–34.0)
MCHC: 32.9 g/dL (ref 32.0–36.0)
MCV: 95.8 fL (ref 80.0–100.0)
MONOS PCT: 8 %
Monocytes Absolute: 0.6 10*3/uL (ref 0.2–0.9)
NEUTROS ABS: 3.7 10*3/uL (ref 1.4–6.5)
NEUTROS PCT: 46 %
Platelets: 245 10*3/uL (ref 150–440)
RBC: 4.5 MIL/uL (ref 3.80–5.20)
RDW: 13 % (ref 11.5–14.5)
WBC: 8.1 10*3/uL (ref 3.6–11.0)

## 2015-12-03 LAB — COMPREHENSIVE METABOLIC PANEL
ALT: 21 U/L (ref 14–54)
ANION GAP: 9 (ref 5–15)
AST: 30 U/L (ref 15–41)
Albumin: 4.7 g/dL (ref 3.5–5.0)
Alkaline Phosphatase: 76 U/L (ref 38–126)
BUN: 18 mg/dL (ref 6–20)
CALCIUM: 9.9 mg/dL (ref 8.9–10.3)
CHLORIDE: 100 mmol/L — AB (ref 101–111)
CO2: 28 mmol/L (ref 22–32)
Creatinine, Ser: 0.7 mg/dL (ref 0.44–1.00)
GFR calc non Af Amer: >60 mL/min (ref >60)
Glucose, Bld: 105 mg/dL — ABNORMAL HIGH (ref 65–99)
POTASSIUM: 4.2 mmol/L (ref 3.5–5.1)
SODIUM: 137 mmol/L (ref 135–145)
Total Bilirubin: 0.5 mg/dL (ref 0.3–1.2)
Total Protein: 8 g/dL (ref 6.5–8.1)

## 2015-12-03 LAB — URINALYSIS COMPLETE WITH MICROSCOPIC (ARMC ONLY)
Bilirubin Urine: NEGATIVE
GLUCOSE, UA: NEGATIVE mg/dL
Hgb urine dipstick: NEGATIVE
Ketones, ur: NEGATIVE mg/dL
Leukocytes, UA: NEGATIVE
NITRITE: NEGATIVE
PROTEIN: NEGATIVE mg/dL
Specific Gravity, Urine: 1.008 (ref 1.005–1.030)
Squamous Epithelial / LPF: NONE SEEN
pH: 8 (ref 5.0–8.0)

## 2015-12-03 LAB — TROPONIN I: Troponin I: <0.03 ng/mL (ref <0.03)

## 2015-12-03 NOTE — ED Provider Notes (Signed)
[facility] Emergency Department Provider Note   ____________________________________________   First MD Initiated Contact with Patient 12/03/15 0217     (approximate)  I have reviewed the triage vital signs and the nursing notes.   HISTORY  Chief Complaint Fall  Caveat-history of present illness and review of systems is limited due to the patient's dementia. Information is obtained from her family at bedside.  HPI Donna Goodman is a 82 y.o. female with history of dementia, HTN, hyperlipidemia, not chronically anticoagulated who presents from [facility] [place] [facility] after a fall which occurred today. The patient does not exactly remember what happened but she reports "I fell, I think I tripped over something". According to her family at bedside, the staff at [facility] house noted that she seemed somewhat weak and was requiring assistance to get up out of bed which she usually doesn't require. The patient has no complaints. She has no pain complaints. She is had no recent illness include no vomiting, diarrhea, fevers or chills. Patient denies any chest pain or difficulty breathing.   Past Medical History:  Diagnosis Date  . Asthma   . COPD (chronic obstructive pulmonary disease) (HCC)   . Dementia   . Hyperlipemia   . Hypertension     There are no active problems to display for this patient.   Past Surgical History:  Procedure Laterality Date  . BACK SURGERY      Prior to Admission medications   Medication Sig Start Date End Date Taking? Authorizing Provider  acetaminophen (TYLENOL) 500 MG tablet Take 500 mg by mouth every 4 (four) hours as needed for mild pain, fever or headache.    Historical Provider, MD  albuterol (PROVENTIL HFA;VENTOLIN HFA) 108 (90 BASE) MCG/ACT inhaler Inhale 2 puffs into the lungs every 4 (four) hours as needed for wheezing or shortness of breath.    Historical Provider, MD  albuterol (PROVENTIL) (2.5 MG/3ML) 0.083% nebulizer  solution Take 2.5 mg by nebulization every 6 (six) hours as needed for wheezing or shortness of breath.    Historical Provider, MD  alum & mag hydroxide-simeth (MAALOX/MYLANTA) 200-200-20 MG/5ML suspension Take 30 mLs by mouth every 6 (six) hours as needed for indigestion or heartburn.    Historical Provider, MD  aspirin 325 MG tablet Take 325 mg by mouth daily.    Historical Provider, MD  calcium gluconate 500 MG tablet Take 1 tablet by mouth 2 (two) times daily.    Historical Provider, MD  Cholecalciferol (VITAMIN D) 2000 UNITS CAPS Take 2,000 Units by mouth daily.    Historical Provider, MD  donepezil (ARICEPT) 10 MG tablet Take 10 mg by mouth at bedtime.    Historical Provider, MD  Fluticasone-Salmeterol (ADVAIR) 100-50 MCG/DOSE AEPB Inhale 1 puff into the lungs 2 (two) times daily.    Historical Provider, MD  guaifenesin (ROBITUSSIN) 100 MG/5ML syrup Take 200 mg by mouth every 6 (six) hours as needed for cough.    Historical Provider, MD  imipramine (TOFRANIL) 25 MG tablet Take 12.5 mg by mouth at bedtime.    Historical Provider, MD  levothyroxine (SYNTHROID, LEVOTHROID) 75 MCG tablet Take 75 mcg by mouth daily.    Historical Provider, MD  loperamide (IMODIUM) 2 MG capsule Take 2 mg by mouth as needed for diarrhea or loose stools.    Historical Provider, MD  LORazepam (ATIVAN) 0.5 MG tablet Take 0.25 mg by mouth 2 (two) times daily as needed for anxiety.    Historical Provider, MD  magnesium hydroxide (MILK  OF MAGNESIA) 400 MG/5ML suspension Take 30 mLs by mouth at bedtime as needed for mild constipation.    Historical Provider, MD  Multiple Vitamin (MULTIVITAMIN WITH MINERALS) TABS tablet Take 1 tablet by mouth daily.    Historical Provider, MD  neomycin-bacitracin-polymyxin (NEOSPORIN) ointment Apply 1 application topically as needed (for minor skin tears or abrasions). apply to eye    Historical Provider, MD  sertraline (ZOLOFT) 50 MG tablet Take 50 mg by mouth daily.    Historical Provider,  MD  simvastatin (ZOCOR) 40 MG tablet Take 40 mg by mouth at bedtime.    Historical Provider, MD  tiotropium (SPIRIVA) 18 MCG inhalation capsule Place 18 mcg into inhaler and inhale daily.    Historical Provider, MD  topiramate (TOPAMAX) 25 MG tablet Take 25 mg by mouth at bedtime.    Historical Provider, MD    Allergies Codeine  History reviewed. No pertinent family history.  Social History Social History  Substance Use Topics  . Smoking status: Former Games developer  . Smokeless tobacco: Never Used  . Alcohol use No    Review of Systems Constitutional: No fever/chills Eyes: No visual changes. ENT: No sore throat. Cardiovascular: Denies chest pain. Respiratory: Denies shortness of breath. Gastrointestinal: No abdominal pain.  No nausea, no vomiting.  No diarrhea.  No constipation. Genitourinary: Negative for dysuria. Musculoskeletal: Negative for back pain. Skin: Negative for rash. Neurological: Negative for headaches, focal weakness or numbness.  10-point ROS otherwise negative.  Caveat-history of present illness and review of systems is limited due to the patient's dementia. Information is obtained from her family at bedside. ____________________________________________   PHYSICAL EXAM:  Vitals:   12/03/15 0200 12/03/15 0230 12/03/15 0300 12/03/15 0313  BP: (!) 156/66 (!) 151/87 135/90 135/90  Pulse:    (!) 58  Resp: 17 18 14 11   Temp:      TempSrc:      SpO2:    100%  Weight:      Height:        VITAL SIGNS: ED Triage Vitals  Enc Vitals Group     BP 12/03/15 0023 (!) 171/94     Pulse Rate 12/03/15 0023 (!) 119     Resp 12/03/15 0023 16     Temp 12/03/15 0023 97.8 F (36.6 C)     Temp Source 12/03/15 0023 Oral     SpO2 12/03/15 0023 100 %     Weight 12/03/15 0028 108 lb (49 kg)     Height 12/03/15 0028 5\' 2"  (1.575 m)     Head Circumference --      Peak Flow --      Pain Score 12/03/15 0116 0     Pain Loc --      Pain Edu? --      Excl. in GC? --      Constitutional: Alert and oriented To self and place but not to year. Well appearing and in no acute distress. Eyes: Conjunctivae are normal. PERRL. EOMI. Head: Atraumatic. Nose: No congestion/rhinnorhea. Mouth/Throat: Mucous membranes are moist.  Oropharynx non-erythematous. Neck: No stridor.  No cervical spine tenderness to palpation. Cardiovascular: Normal rate, regular rhythm. Grossly normal heart sounds.  Good peripheral circulation. Respiratory: Normal respiratory effort.  No retractions. Lungs CTAB. Gastrointestinal: Soft and nontender. No distention.  No CVA tenderness. Genitourinary: deferred Musculoskeletal: No lower extremity tenderness nor edema.  No joint effusions. No midline T or L-spine tenderness to palpation. Full active painless range of motion at the hip joints bilaterally. Neurologic:  Normal speech and language. No gross focal neurologic deficits are appreciated. No gait instability, patient ambulates well with minimal assistance.. 5 out of 5 strength in bilateral upper and lower extremities, sensation intact to light touch throughout. Skin:  Skin is warm, dry and intact. No rash noted. Psychiatric: Mood and affect are normal. Speech and behavior are normal.  ____________________________________________   LABS (all labs ordered are listed, but only abnormal results are displayed)  Labs Reviewed  COMPREHENSIVE METABOLIC PANEL - Abnormal; Notable for the following:       Result Value   Chloride 100 (*)    Glucose, Bld 105 (*)    All other components within normal limits  URINALYSIS COMPLETEWITH MICROSCOPIC ([facility] ONLY) - Abnormal; Notable for the following:    Color, Urine YELLOW (*)    APPearance CLOUDY (*)    Bacteria, UA RARE (*)    All other components within normal limits  URINE CULTURE  CBC WITH DIFFERENTIAL/PLATELET  TROPONIN I   ____________________________________________  EKG  ED ECG REPORT I, Gayla DossGayle, Kazia Grisanti A, the attending physician,  personally viewed and interpreted this ECG.   Date: 12/03/2015  EKG Time: 00:28  Rate: 59  Rhythm: sinus bradycardia with occasional PVCs  Axis: left  Intervals:none  ST&T Change: No acute ST elevation or acute ST depression. T-wave inversion noted in lead 3, otherwise, the EKG appears similar to that obtained on 04/06/2014.  ____________________________________________  RADIOLOGY  CT head IMPRESSION:  No acute intracranial abnormality. Stable atrophy and chronic small  vessel ischemia.     CXR IMPRESSION:  No active cardiopulmonary disease.    Age-indeterminate mild anterior compression deformity of mid  thoracic vertebral body, approximately T6.    ____________________________________________   PROCEDURES  Procedure(s) performed: None  Procedures  Critical Care performed: No  ____________________________________________   INITIAL IMPRESSION / ASSESSMENT AND PLAN / ED COURSE  Pertinent labs & imaging results that were available during my care of the patient were reviewed by me and considered in my medical decision making (see chart for details).  Ezzard Standingmma N Purnell is a 82 y.o. female with history of dementia, HTN, hyperlipidemia, not chronically anticoagulated who presents from [facility] after a fall which occurred today. On exam, she is very well-appearing and in no acute distress. Mildly tachycardic on arrival however that has resolved at the time of my assessment and prior to any ER intervention. Vital signs are stable and she is afebrile. She has a benign atraumatic exam. She has an intact neurological examination. She has no objective weakness, ambulates well without any difficulty, with minimal assistance which is her baseline. Screening labs pending as is urinalysis. We'll obtain CT head and chest x-ray and reassess for disposition.  ----------------------------------------- 4:13 AM on 12/03/2015 ----------------------------------------- Patient  continues to appear well without complaints. Continues to ambulate well. Labs reviewed. Unremarkable CBC, CMP. Negative troponin. UA not consistent with infection. CT head negative for any acute intracranial process. CXR shows no acute cardiopulmonary abnormality. Reason age-indeterminate T6 compression deformity however patient has no tenderness throughout the T spine and I suspect this is old. Discussed return precautions with her family at bedside, need for close PCP follow-up and all are comfortable with the discharge plan. DC home.   Clinical Course     ____________________________________________   FINAL CLINICAL IMPRESSION(S) / ED DIAGNOSES  Final diagnoses:  Fall, initial encounter  Generalized weakness      NEW MEDICATIONS STARTED DURING THIS VISIT:  New Prescriptions   No medications on file  Note:  This document was prepared using Dragon voice recognition software and may include unintentional dictation errors.    Gayla Doss, MD 12/03/15 [phone]

## 2015-12-03 NOTE — ED Notes (Signed)
Pt returned from CT °

## 2015-12-03 NOTE — ED Triage Notes (Signed)
Pt had unwitnessed fall and unknown down time at [facility], assisted living. Pt states she remembers falling but does not know why she fell. Pt was reported to not be able to get out of bed or ambulate on her own and normally she is reasonably independent.

## 2015-12-03 NOTE — ED Notes (Signed)
Patient transported to CT 

## 2015-12-04 LAB — URINE CULTURE: CULTURE: NO GROWTH

## 2016-01-29 ENCOUNTER — Inpatient Hospital Stay
Admission: EM | Admit: 2016-01-29 | Discharge: 2016-02-01 | DRG: 193 | Disposition: A | Discharge status: Skilled Nursing Facility | Payer: Medicare Other | Attending: Internal Medicine | Admitting: Internal Medicine

## 2016-01-29 ENCOUNTER — Emergency Department: Payer: Medicare Other

## 2016-01-29 ENCOUNTER — Encounter: Payer: Self-pay | Admitting: Emergency Medicine

## 2016-01-29 ENCOUNTER — Inpatient Hospital Stay: Payer: Medicare Other

## 2016-01-29 DIAGNOSIS — J440 Chronic obstructive pulmonary disease with acute lower respiratory infection: Secondary | ICD-10-CM | POA: Diagnosis present

## 2016-01-29 DIAGNOSIS — E785 Hyperlipidemia, unspecified: Secondary | ICD-10-CM | POA: Diagnosis present

## 2016-01-29 DIAGNOSIS — F0280 Dementia in other diseases classified elsewhere without behavioral disturbance: Secondary | ICD-10-CM | POA: Diagnosis present

## 2016-01-29 DIAGNOSIS — M6281 Muscle weakness (generalized): Secondary | ICD-10-CM

## 2016-01-29 DIAGNOSIS — G20 Parkinson's disease: Secondary | ICD-10-CM | POA: Diagnosis present

## 2016-01-29 DIAGNOSIS — J189 Pneumonia, unspecified organism: Principal | ICD-10-CM | POA: Diagnosis present

## 2016-01-29 DIAGNOSIS — I10 Essential (primary) hypertension: Secondary | ICD-10-CM | POA: Diagnosis present

## 2016-01-29 DIAGNOSIS — I119 Hypertensive heart disease without heart failure: Secondary | ICD-10-CM | POA: Diagnosis present

## 2016-01-29 DIAGNOSIS — F0390 Unspecified dementia without behavioral disturbance: Secondary | ICD-10-CM | POA: Diagnosis present

## 2016-01-29 DIAGNOSIS — G9341 Metabolic encephalopathy: Secondary | ICD-10-CM | POA: Diagnosis present

## 2016-01-29 DIAGNOSIS — R262 Difficulty in walking, not elsewhere classified: Secondary | ICD-10-CM

## 2016-01-29 DIAGNOSIS — R053 Chronic cough: Secondary | ICD-10-CM

## 2016-01-29 DIAGNOSIS — Z87891 Personal history of nicotine dependence: Secondary | ICD-10-CM

## 2016-01-29 DIAGNOSIS — Y95 Nosocomial condition: Secondary | ICD-10-CM | POA: Diagnosis present

## 2016-01-29 DIAGNOSIS — Z7982 Long term (current) use of aspirin: Secondary | ICD-10-CM | POA: Diagnosis not present

## 2016-01-29 DIAGNOSIS — J441 Chronic obstructive pulmonary disease with (acute) exacerbation: Secondary | ICD-10-CM | POA: Diagnosis present

## 2016-01-29 DIAGNOSIS — R05 Cough: Secondary | ICD-10-CM

## 2016-01-29 DIAGNOSIS — R4182 Altered mental status, unspecified: Secondary | ICD-10-CM

## 2016-01-29 DIAGNOSIS — R0602 Shortness of breath: Secondary | ICD-10-CM

## 2016-01-29 LAB — URINALYSIS, COMPLETE (UACMP) WITH MICROSCOPIC
BILIRUBIN URINE: NEGATIVE
Bacteria, UA: NONE SEEN
Glucose, UA: NEGATIVE mg/dL
Hgb urine dipstick: NEGATIVE
KETONES UR: NEGATIVE mg/dL
Leukocytes, UA: NEGATIVE
Nitrite: NEGATIVE
Protein, ur: 30 mg/dL — AB
SPECIFIC GRAVITY, URINE: 1.023 (ref 1.005–1.030)
pH: 7 (ref 5.0–8.0)

## 2016-01-29 LAB — COMPREHENSIVE METABOLIC PANEL
ALK PHOS: 54 U/L (ref 38–126)
ALT: 15 U/L (ref 14–54)
AST: 26 U/L (ref 15–41)
Albumin: 3.5 g/dL (ref 3.5–5.0)
Anion gap: 9 (ref 5–15)
BILIRUBIN TOTAL: 0.7 mg/dL (ref 0.3–1.2)
BUN: 19 mg/dL (ref 6–20)
CALCIUM: 9.1 mg/dL (ref 8.9–10.3)
CO2: 26 mmol/L (ref 22–32)
CREATININE: 0.72 mg/dL (ref 0.44–1.00)
Chloride: 103 mmol/L (ref 101–111)
Glucose, Bld: 173 mg/dL — ABNORMAL HIGH (ref 65–99)
Potassium: 3.3 mmol/L — ABNORMAL LOW (ref 3.5–5.1)
SODIUM: 138 mmol/L (ref 135–145)
Total Protein: 6.8 g/dL (ref 6.5–8.1)

## 2016-01-29 LAB — CBC
HEMATOCRIT: 36.2 % (ref 35.0–47.0)
HEMOGLOBIN: 12 g/dL (ref 12.0–16.0)
MCH: 31.3 pg (ref 26.0–34.0)
MCHC: 33.2 g/dL (ref 32.0–36.0)
MCV: 94.2 fL (ref 80.0–100.0)
Platelets: 202 10*3/uL (ref 150–440)
RBC: 3.84 MIL/uL (ref 3.80–5.20)
RDW: 12.8 % (ref 11.5–14.5)
WBC: 14.4 10*3/uL — ABNORMAL HIGH (ref 3.6–11.0)

## 2016-01-29 LAB — TROPONIN I: Troponin I: <0.03 ng/mL (ref <0.03)

## 2016-01-29 LAB — BRAIN NATRIURETIC PEPTIDE: B NATRIURETIC PEPTIDE 5: 167 pg/mL — AB (ref 0.0–100.0)

## 2016-01-29 LAB — MRSA PCR SCREENING: MRSA by PCR: NEGATIVE

## 2016-01-29 LAB — INFLUENZA PANEL BY PCR (TYPE A & B)
Influenza A By PCR: NEGATIVE
Influenza B By PCR: NEGATIVE

## 2016-01-29 MED ORDER — ASPIRIN EC 81 MG PO TBEC
81.0000 mg | DELAYED_RELEASE_TABLET | Freq: Every day | ORAL | Status: DC
Start: 2016-01-29 — End: 2016-02-01
  Administered 2016-01-29 – 2016-02-01 (×4): 81 mg via ORAL
  Filled 2016-01-29 (×4): qty 1

## 2016-01-29 MED ORDER — DIVALPROEX SODIUM 125 MG PO DR TAB
125.0000 mg | DELAYED_RELEASE_TABLET | Freq: Three times a day (TID) | ORAL | Status: DC
  Administered 2016-01-29 – 2016-02-01 (×8): 125 mg via ORAL
  Filled 2016-01-29 (×12): qty 1

## 2016-01-29 MED ORDER — GUAIFENESIN-DM 100-10 MG/5ML PO SYRP: 5.0000 mL | ORAL_SOLUTION | ORAL | Status: DC | PRN

## 2016-01-29 MED ORDER — LORATADINE 10 MG PO TABS
10.0000 mg | ORAL_TABLET | Freq: Every day | ORAL | Status: DC
  Administered 2016-01-29 – 2016-02-01 (×4): 10 mg via ORAL
  Filled 2016-01-29 (×4): qty 1

## 2016-01-29 MED ORDER — POTASSIUM CHLORIDE IN NACL 20-0.9 MEQ/L-% IV SOLN
INTRAVENOUS | Status: DC
  Administered 2016-01-29 – 2016-01-31 (×3): via INTRAVENOUS
  Filled 2016-01-29 (×7): qty 1000

## 2016-01-29 MED ORDER — ONDANSETRON HCL 4 MG PO TABS: 4.0000 mg | ORAL_TABLET | Freq: Four times a day (QID) | ORAL | Status: DC | PRN

## 2016-01-29 MED ORDER — IBUPROFEN 600 MG PO TABS
ORAL_TABLET | ORAL | Status: AC
  Administered 2016-01-29: 600 mg via ORAL
  Filled 2016-01-29: qty 1

## 2016-01-29 MED ORDER — DICLOFENAC SODIUM 1 % TD GEL
2.0000 g | Freq: Every day | TRANSDERMAL | Status: DC | PRN
  Filled 2016-01-29: qty 100

## 2016-01-29 MED ORDER — METOPROLOL SUCCINATE ER 25 MG PO TB24
25.0000 mg | ORAL_TABLET | Freq: Every day | ORAL | Status: DC
  Administered 2016-01-29 – 2016-02-01 (×4): 25 mg via ORAL
  Filled 2016-01-29 (×4): qty 1

## 2016-01-29 MED ORDER — PANTOPRAZOLE SODIUM 40 MG PO TBEC
40.0000 mg | DELAYED_RELEASE_TABLET | Freq: Every day | ORAL | Status: DC
  Administered 2016-01-30 – 2016-02-01 (×3): 40 mg via ORAL
  Filled 2016-01-29 (×3): qty 1

## 2016-01-29 MED ORDER — IPRATROPIUM-ALBUTEROL 0.5-2.5 (3) MG/3ML IN SOLN
3.0000 mL | Freq: Four times a day (QID) | RESPIRATORY_TRACT | Status: DC
  Administered 2016-01-29 – 2016-01-31 (×7): 3 mL via RESPIRATORY_TRACT
  Filled 2016-01-29 (×7): qty 3

## 2016-01-29 MED ORDER — DOCUSATE SODIUM 100 MG PO CAPS
100.0000 mg | ORAL_CAPSULE | Freq: Two times a day (BID) | ORAL | Status: DC
  Administered 2016-01-29 – 2016-02-01 (×6): 100 mg via ORAL
  Filled 2016-01-29 (×6): qty 1

## 2016-01-29 MED ORDER — MOMETASONE FURO-FORMOTEROL FUM 100-5 MCG/ACT IN AERO
2.0000 | INHALATION_SPRAY | Freq: Two times a day (BID) | RESPIRATORY_TRACT | Status: DC
  Administered 2016-01-29 – 2016-02-01 (×6): 2 via RESPIRATORY_TRACT
  Filled 2016-01-29: qty 8.8

## 2016-01-29 MED ORDER — SERTRALINE HCL 50 MG PO TABS
50.0000 mg | ORAL_TABLET | Freq: Every day | ORAL | Status: DC
  Administered 2016-01-30 – 2016-02-01 (×3): 50 mg via ORAL
  Filled 2016-01-29 (×3): qty 1

## 2016-01-29 MED ORDER — LEVOTHYROXINE SODIUM 75 MCG PO TABS
75.0000 ug | ORAL_TABLET | Freq: Every day | ORAL | Status: DC
  Administered 2016-01-30 – 2016-02-01 (×3): 75 ug via ORAL
  Filled 2016-01-29 (×3): qty 1

## 2016-01-29 MED ORDER — PIPERACILLIN-TAZOBACTAM 3.375 G IVPB
3.3750 g | Freq: Three times a day (TID) | INTRAVENOUS | Status: DC
  Administered 2016-01-29 – 2016-02-01 (×8): 3.375 g via INTRAVENOUS
  Filled 2016-01-29 (×8): qty 50

## 2016-01-29 MED ORDER — BISACODYL 10 MG RE SUPP: 10.0000 mg | Freq: Every day | RECTAL | Status: DC | PRN

## 2016-01-29 MED ORDER — TOPIRAMATE 25 MG PO TABS
25.0000 mg | ORAL_TABLET | Freq: Every day | ORAL | Status: DC
  Administered 2016-01-29 – 2016-01-31 (×3): 25 mg via ORAL
  Filled 2016-01-29 (×3): qty 1

## 2016-01-29 MED ORDER — ALBUTEROL SULFATE HFA 108 (90 BASE) MCG/ACT IN AERS: 2.0000 | INHALATION_SPRAY | RESPIRATORY_TRACT | Status: DC | PRN

## 2016-01-29 MED ORDER — METHYLPREDNISOLONE SODIUM SUCC 125 MG IJ SOLR
60.0000 mg | Freq: Two times a day (BID) | INTRAMUSCULAR | Status: DC
  Administered 2016-01-29 – 2016-02-01 (×6): 60 mg via INTRAVENOUS
  Filled 2016-01-29 (×5): qty 2

## 2016-01-29 MED ORDER — LEVOFLOXACIN IN D5W 750 MG/150ML IV SOLN
750.0000 mg | Freq: Once | INTRAVENOUS | Status: DC
  Administered 2016-01-29: 750 mg via INTRAVENOUS
  Filled 2016-01-29: qty 150

## 2016-01-29 MED ORDER — ACETAMINOPHEN 650 MG RE SUPP: 650.0000 mg | Freq: Four times a day (QID) | RECTAL | Status: DC | PRN

## 2016-01-29 MED ORDER — METHYLPREDNISOLONE SODIUM SUCC 40 MG IJ SOLR
INTRAMUSCULAR | Status: AC
  Administered 2016-01-29: 60 mg
  Filled 2016-01-29: qty 2

## 2016-01-29 MED ORDER — HEPARIN SODIUM (PORCINE) 5000 UNIT/ML IJ SOLN
5000.0000 [IU] | Freq: Three times a day (TID) | INTRAMUSCULAR | Status: DC
  Administered 2016-01-29 – 2016-01-30 (×2): 5000 [IU] via SUBCUTANEOUS
  Filled 2016-01-29 (×2): qty 1

## 2016-01-29 MED ORDER — ONDANSETRON HCL 4 MG/2ML IJ SOLN: 4.0000 mg | Freq: Four times a day (QID) | INTRAMUSCULAR | Status: DC | PRN

## 2016-01-29 MED ORDER — ADULT MULTIVITAMIN W/MINERALS CH
1.0000 | ORAL_TABLET | Freq: Every day | ORAL | Status: DC
  Administered 2016-01-30 – 2016-02-01 (×3): 1 via ORAL
  Filled 2016-01-29 (×3): qty 1

## 2016-01-29 MED ORDER — ACETAMINOPHEN 325 MG PO TABS: 650.0000 mg | ORAL_TABLET | Freq: Four times a day (QID) | ORAL | Status: DC | PRN

## 2016-01-29 MED ORDER — ALBUTEROL SULFATE (2.5 MG/3ML) 0.083% IN NEBU: 2.5000 mg | INHALATION_SOLUTION | RESPIRATORY_TRACT | Status: DC | PRN

## 2016-01-29 MED ORDER — GUAIFENESIN ER 600 MG PO TB12
600.0000 mg | ORAL_TABLET | Freq: Two times a day (BID) | ORAL | Status: DC
  Administered 2016-01-29 – 2016-02-01 (×7): 600 mg via ORAL
  Filled 2016-01-29 (×7): qty 1

## 2016-01-29 MED ORDER — LORAZEPAM 0.5 MG PO TABS: 0.5000 mg | ORAL_TABLET | Freq: Four times a day (QID) | ORAL | Status: DC | PRN

## 2016-01-29 MED ORDER — PIPERACILLIN-TAZOBACTAM 3.375 G IVPB
INTRAVENOUS | Status: AC
Start: 2016-01-29 — End: 2016-01-30
  Filled 2016-01-29: qty 50

## 2016-01-29 MED ORDER — PIPERACILLIN-TAZOBACTAM 3.375 G IVPB 30 MIN
3.3750 g | Freq: Once | INTRAVENOUS | Status: AC
  Administered 2016-01-29: 3.375 g via INTRAVENOUS

## 2016-01-29 MED ORDER — DONEPEZIL HCL 5 MG PO TABS
5.0000 mg | ORAL_TABLET | Freq: Every day | ORAL | Status: DC
  Administered 2016-01-29 – 2016-01-31 (×3): 5 mg via ORAL
  Filled 2016-01-29 (×3): qty 1

## 2016-01-29 MED ORDER — IBUPROFEN 600 MG PO TABS
600.0000 mg | ORAL_TABLET | Freq: Four times a day (QID) | ORAL | Status: DC | PRN
  Administered 2016-01-29: 600 mg via ORAL

## 2016-01-29 MED ORDER — IPRATROPIUM-ALBUTEROL 0.5-2.5 (3) MG/3ML IN SOLN
3.0000 mL | Freq: Once | RESPIRATORY_TRACT | Status: AC
  Administered 2016-01-29: 3 mL via RESPIRATORY_TRACT
  Filled 2016-01-29: qty 3

## 2016-01-29 MED ORDER — ATORVASTATIN CALCIUM 20 MG PO TABS
20.0000 mg | ORAL_TABLET | Freq: Every day | ORAL | Status: DC
  Administered 2016-01-29 – 2016-01-31 (×3): 20 mg via ORAL
  Filled 2016-01-29 (×3): qty 1

## 2016-01-29 MED ORDER — FLUTICASONE PROPIONATE 50 MCG/ACT NA SUSP
2.0000 | Freq: Every day | NASAL | Status: DC
  Administered 2016-01-30 – 2016-01-31 (×2): 2 via NASAL
  Filled 2016-01-29: qty 16

## 2016-01-29 NOTE — H&P (Signed)
History and Physical    Donna Goodman DOB: 1927/04/15 DOA: 01/29/2016  Referring physician: Dr. Sharma Covert PCP: Pcp Not In System  Specialists: none  Chief Complaint: SOB and cough with confusion  HPI: Donna Goodman is a 82 y.o. female has a past medical history significant for COPD, HTN, and dementia who presents from ALF with worsening cough, confusion, and SOB. Pt is demented and is a poor historian. Does c/o persistent cough with SOB. Also with chest and abdominal pain from coughing. In ER, WBC elevated and CXR suggests bibasilar opacities worrisome for pneumonia. She is now admitted.  Review of Systems: The patient denies anorexia, fever, weight loss,, vision loss, decreased hearing, hoarseness,  syncope,, peripheral edema, balance deficits, hemoptysis, abdominal pain, melena, hematochezia, severe indigestion/heartburn, hematuria, incontinence, genital sores, muscle weakness, suspicious skin lesions, transient blindness, difficulty walking, depression, unusual weight change, abnormal bleeding, enlarged lymph nodes, angioedema, and breast masses.   Past Medical History:  Diagnosis Date  . Asthma   . COPD (chronic obstructive pulmonary disease) (HCC)   . Dementia   . Hyperlipemia   . Hypertension    Past Surgical History:  Procedure Laterality Date  . BACK SURGERY     Social History:  reports that she has quit smoking. She has never used smokeless tobacco. She reports that she does not drink alcohol or use drugs.  No Known Allergies  History reviewed. No pertinent family history.  Prior to Admission medications   Medication Sig Start Date End Date Taking? Authorizing Provider  acetaminophen (TYLENOL) 500 MG chewable tablet Chew 500 mg by mouth every 8 (eight) hours as needed for pain or fever.   Yes Historical Provider, MD  acetaminophen (TYLENOL) 500 MG tablet Take 500 mg by mouth 3 (three) times daily.    Yes Historical Provider, MD  albuterol (PROVENTIL  HFA;VENTOLIN HFA) 108 (90 BASE) MCG/ACT inhaler Inhale 2 puffs into the lungs every 4 (four) hours as needed for wheezing.    Yes Historical Provider, MD  albuterol (PROVENTIL) (2.5 MG/3ML) 0.083% nebulizer solution Take 2.5 mg by nebulization every 6 (six) hours as needed for wheezing or shortness of breath.   Yes Historical Provider, MD  alum & mag hydroxide-simeth (MAALOX/MYLANTA) 200-200-20 MG/5ML suspension Take 30 mLs by mouth 4 (four) times daily as needed for indigestion or heartburn.    Yes Historical Provider, MD  aspirin EC 81 MG tablet Take 81 mg by mouth daily.   Yes Historical Provider, MD  atorvastatin (LIPITOR) 20 MG tablet Take 20 mg by mouth at bedtime.   Yes Historical Provider, MD  calcium citrate (CALCITRATE - DOSED IN MG ELEMENTAL CALCIUM) 950 MG tablet Take 100 mg of elemental calcium by mouth daily.   Yes Historical Provider, MD  Cholecalciferol (VITAMIN D) 2000 UNITS CAPS Take 2,000 Units by mouth daily.   Yes Historical Provider, MD  diclofenac sodium (VOLTAREN) 1 % GEL Apply 2 g topically daily as needed. To right hip for pain   Yes Historical Provider, MD  divalproex (DEPAKOTE) 125 MG DR tablet Take 125 mg by mouth 3 (three) times daily.   Yes Historical Provider, MD  donepezil (ARICEPT) 5 MG tablet Take 5 mg by mouth at bedtime.   Yes Historical Provider, MD  fluticasone (FLONASE) 50 MCG/ACT nasal spray Place 2 sprays into both nostrils daily.   Yes Historical Provider, MD  Fluticasone-Salmeterol (ADVAIR) 100-50 MCG/DOSE AEPB Inhale 1 puff into the lungs 2 (two) times daily.   Yes Historical Provider, MD  guaifenesin (ROBITUSSIN) 100 MG/5ML syrup Take 200 mg by mouth every 6 (six) hours as needed for cough.   Yes Historical Provider, MD  ibuprofen (ADVIL,MOTRIN) 400 MG tablet Take 400 mg by mouth 2 (two) times daily as needed for moderate pain.   Yes Historical Provider, MD  levothyroxine (SYNTHROID, LEVOTHROID) 75 MCG tablet Take 75 mcg by mouth daily.   Yes Historical  Provider, MD  loperamide (IMODIUM) 2 MG capsule Take 2 mg by mouth as needed for diarrhea or loose stools.   Yes Historical Provider, MD  loratadine (CLARITIN) 10 MG tablet Take 10 mg by mouth daily.   Yes Historical Provider, MD  LORazepam (ATIVAN) 0.5 MG tablet Take 0.25 mg by mouth daily as needed for anxiety.    Yes Historical Provider, MD  LORazepam (ATIVAN) 0.5 MG tablet Take 0.5 mg by mouth at bedtime.   Yes Historical Provider, MD  magnesium hydroxide (MILK OF MAGNESIA) 400 MG/5ML suspension Take 30 mLs by mouth at bedtime as needed for mild constipation.   Yes Historical Provider, MD  metoprolol succinate (TOPROL-XL) 25 MG 24 hr tablet Take 25 mg by mouth daily.   Yes Historical Provider, MD  Multiple Vitamin (MULTIVITAMIN WITH MINERALS) TABS tablet Take 1 tablet by mouth daily.   Yes Historical Provider, MD  neomycin-bacitracin-polymyxin (NEOSPORIN) ointment Apply 1 application topically as needed for wound care. apply to eye   Yes Historical Provider, MD  sertraline (ZOLOFT) 50 MG tablet Take 50 mg by mouth daily.   Yes Historical Provider, MD  topiramate (TOPAMAX) 25 MG tablet Take 25 mg by mouth at bedtime.   Yes Historical Provider, MD   Physical Exam: Vitals:   01/29/16 1318  BP: 115/64  Pulse: 66  Resp: 20  Temp: 98.3 F (36.8 C)  TempSrc: Oral  SpO2: 94%  Weight: 55 kg (121 lb 3.2 oz)  Height: 5\' 2"  (1.575 m)     General:  No apparent distress, WDWN, [place]/AT  Eyes: PERRL, EOMI, no scleral icterus, conjunctiva clear  ENT: moist oropharynx without exudate, TM's benign, dentition fair  Neck: supple, no lymphadenopathy. No bruits or thyromegaly  Cardiovascular: regular rate without MRG; 2+ peripheral pulses, no JVD, no peripheral edema  Respiratory: bibasilar rhonchi without wheezes or rales, No dullness. Respiratory effort increased  Abdomen: soft, non tender to palpation, positive bowel sounds, no guarding, no rebound  Skin: no rashes or  lesions  Musculoskeletal: normal bulk and tone, no joint swelling  Psychiatric: normal mood and affect, A&OX2(disoriented to time)  Neurologic: CN 2-12 grossly intact, Motor strength 5/5 in all 4 groups with symmetric DTR's and non-focal sensory exam  Labs on Admission:  Basic Metabolic Panel:  Recent Labs Lab 01/29/16 1330  NA 138  K 3.3*  CL 103  CO2 26  GLUCOSE 173*  BUN 19  CREATININE 0.72  CALCIUM 9.1   Liver Function Tests:  Recent Labs Lab 01/29/16 1330  AST 26  ALT 15  ALKPHOS 54  BILITOT 0.7  PROT 6.8  ALBUMIN 3.5   No results for input(s): LIPASE, AMYLASE in the last 168 hours. No results for input(s): AMMONIA in the last 168 hours. CBC:  Recent Labs Lab 01/29/16 1330  WBC 14.4*  HGB 12.0  HCT 36.2  MCV 94.2  PLT 202   Cardiac Enzymes:  Recent Labs Lab 01/29/16 1330  TROPONINI <0.03    BNP (last 3 results)  Recent Labs  01/29/16 1330  BNP 167.0*    ProBNP (last 3 results) No results  for input(s): PROBNP in the last 8760 hours.  CBG: No results for input(s): GLUCAP in the last 168 hours.  Radiological Exams on Admission: Dg Chest 2 View  Result Date: 01/29/2016 CLINICAL DATA:  Altered mental status. History of asthma, COPD, dementia and hypertension. Former smoker. EXAM: CHEST  2 VIEW COMPARISON:  12/03/2015; 04/29/2014; 04/07/2013 FINDINGS: Grossly unchanged enlarged cardiac silhouette and mediastinal contours given slightly reduced lung volumes. Worsening bibasilar opacities favored to represent atelectasis. No discrete focal airspace opacities. No pleural effusion or pneumothorax. No evidence of evidence of edema. No acute osseous abnormalities. IMPRESSION: Minimal bibasilar atelectasis without acute cardiopulmonary disease. Electronically Signed   By: Simonne ComeJohn  Watts M.D.   On: 01/29/2016 13:57   Ct Head Wo Contrast  Result Date: 01/29/2016 CLINICAL DATA:  Disorientation. EXAM: CT HEAD WITHOUT CONTRAST TECHNIQUE: Contiguous axial  images were obtained from the base of the skull through the vertex without intravenous contrast. COMPARISON:  12/03/2015 FINDINGS: Brain: No evidence of acute infarction, hemorrhage, hydrocephalus, extra-axial collection or mass lesion/mass effect. Moderate to advanced microvascular disease of the deep white matter and brain parenchymal volume loss. Vascular: No hyperdense vessel. Calcific atherosclerotic disease at the skullbase. Skull: Normal. Negative for fracture or focal lesion. Sinuses/Orbits: No acute finding. Other: None. IMPRESSION: No acute intracranial abnormality. Atrophy, chronic microvascular disease. Electronically Signed   By: Ted Mcalpineobrinka  Dimitrova M.D.   On: 01/29/2016 14:10    EKG: Independently reviewed.  Assessment/Plan Principal Problem:   Multifocal pneumonia Active Problems:   SOB (shortness of breath)   Cough, persistent   COPD with acute exacerbation (HCC)   Will admit to floor with IV fluids, IV ABX, IV steroids, and SVN's. O2 as needed. Order CT chest. PT and CSW consults. Supplement K+. Repeat labs in AM.  Diet: low salt Fluids: NS@75  DVT Prophylaxis: SQ Heparin  Code Status: FULL  Family Communication: yes  Disposition Plan: SNF  Time spent: 55 min

## 2016-01-29 NOTE — ED Provider Notes (Signed)
[facility] Emergency Department Provider Note  ____________________________________________  Time seen: Approximately 1:30 PM  I have reviewed the triage vital signs and the nursing notes.   HISTORY  Chief Complaint Altered Mental Status  The patient gives some history but this is limited due to her dementia.  HPI Donna Goodman is a 82 y.o. female with a history of COPD and asthma, dementia, HTN and HL, sent from her nursing homefor altered mental status and cough. Per the patient she has had a nonproductive cough for 1 week but does not notice any shortness of breath when she is walking around with her walker, nor any fever or chills. She has not had any nausea, vomiting, diarrhea, abdominal pain. The report from the nursing home is that the patient has had altered mental status, and "can't remember to swallow or use the bathroom."   Past Medical History:  Diagnosis Date  . Asthma   . COPD (chronic obstructive pulmonary disease) (HCC)   . Dementia   . Hyperlipemia   . Hypertension     There are no active problems to display for this patient.   Past Surgical History:  Procedure Laterality Date  . BACK SURGERY      Current Outpatient Rx  . Order #: [id number] Class: Historical Med  . Order #: [id number] Class: Historical Med  . Order #: [id number] Class: Historical Med  . Order #: [id number] Class: Historical Med  . Order #: [id number] Class: Historical Med  . Order #: [id number] Class: Historical Med  . Order #: [id number] Class: Historical Med  . Order #: [id number] Class: Historical Med  . Order #: [id number] Class: Historical Med  . Order #: [id number] Class: Historical Med  . Order #: [id number] Class: Historical Med  . Order #: [id number] Class: Historical Med  . Order #: [id number] Class: Historical Med  . Order #: [id number] Class: Historical Med  . Order #: [id number] Class: Historical Med  . Order #: [id number] Class: Historical Med  . Order #: [id number] Class:  Historical Med  . Order #: [id number] Class: Historical Med  . Order #: [id number] Class: Historical Med  . Order #: [id number] Class: Historical Med  . Order #: [id number] Class: Historical Med  . Order #: [id number] Class: Historical Med  . Order #: [id number] Class: Historical Med  . Order #: [id number] Class: Historical Med  . Order #: [id number] Class: Historical Med  . Order #: [id number] Class: Historical Med  . Order #: [id number] Class: Historical Med    Allergies Patient has no known allergies.  History reviewed. No pertinent family history.  Social History Social History  Substance Use Topics  . Smoking status: Former Games developermoker  . Smokeless tobacco: Never Used  . Alcohol use No    Review of Systems Constitutional: No fever/chills.No lightheadedness or syncope. Eyes: No visual changes. No eye discharge. ENT: No sore throat. No congestion or rhinorrhea. Cardiovascular: Denies chest pain. Denies palpitations. Respiratory: Denies shortness of breath.  Positive dry cough. Gastrointestinal: No abdominal pain.  No nausea, no vomiting.  No diarrhea.  No constipation. Genitourinary: Negative for dysuria. Musculoskeletal: Negative for back pain. Skin: Negative for rash. Neurological: Negative for headaches. No focal numbness, tingling or weakness. Altered mental status.  10-point ROS otherwise negative.  ____________________________________________   PHYSICAL EXAM:  VITAL SIGNS: ED Triage Vitals [01/29/16 1318]  Enc Vitals Group     BP 115/64     Pulse Rate 66     Resp 20     Temp 98.3 F (36.8 C)     Temp Source Oral     SpO2 94 %  Weight 121 lb 3.2 oz (55 kg)     Height 5\' 2"  (1.575 m)     Head Circumference      Peak Flow      Pain Score      Pain Loc      Pain Edu?      Excl. in GC?     Constitutional: A she is alert and answers most questions appropriately. She is able to follow commands. She is chronically ill appearing but nontoxic. Eyes: Conjunctivae are normal.   EOMI. No scleral icterus. No eye discharge. Head: Atraumatic. Nose: No congestion/rhinnorhea. Mouth/Throat: Mucous membranes are moist.  Neck: No stridor.  Supple.  No JVD. No meningismus. Cardiovascular: Normal rate, regular rhythm. No murmurs, rubs or gallops.  Respiratory: Normal respiratory effort.  No accessory muscle use or retractions. Mild end expiratory wheezing. Rales in the right lung base and middle lung. Good air exchange with oxygen saturation of 96% on my examination.  Gastrointestinal: Soft, nontender and nondistended.  No guarding or rebound.  No peritoneal signs. Musculoskeletal: No LE edema. No ttp in the calves or palpable cords.  Negative Homan's sign. Neurologic:  A&O to person and place, knows it is December but does not know the year.  Speech is clear.  Face and smile are symmetric.  EOMI.  Moves all extremities well. Skin:  Skin is warm, dry and intact. No rash noted. Psychiatric: Mood and affect are normal. Speech and behavior are normal.  Normal judgement.  ____________________________________________   LABS (all labs ordered are listed, but only abnormal results are displayed)  Labs Reviewed  CBC - Abnormal; Notable for the following:       Result Value   WBC 14.4 (*)    All other components within normal limits  COMPREHENSIVE METABOLIC PANEL - Abnormal; Notable for the following:    Potassium 3.3 (*)    Glucose, Bld 173 (*)    All other components within normal limits  BRAIN NATRIURETIC PEPTIDE - Abnormal; Notable for the following:    B Natriuretic Peptide 167.0 (*)    All other components within normal limits  URINALYSIS, COMPLETE (UACMP) WITH MICROSCOPIC - Abnormal; Notable for the following:    Color, Urine AMBER (*)    APPearance CLEAR (*)    Protein, ur 30 (*)    Squamous Epithelial / LPF 0-5 (*)    All other components within normal limits  CULTURE, BLOOD (ROUTINE X 2)  CULTURE, BLOOD (ROUTINE X 2)  URINE CULTURE  TROPONIN I    ____________________________________________  EKG  ED ECG REPORT I, Rockne Menghini, the attending physician, personally viewed and interpreted this ECG.   Date: 01/29/2016  EKG Time: 1319  Rate: 65  Rhythm: normal sinus rhythm  Axis: leftward  Intervals:none  ST&T Change: No ST elevation.  ____________________________________________  RADIOLOGY  Dg Chest 2 View  Result Date: 01/29/2016 CLINICAL DATA:  Altered mental status. History of asthma, COPD, dementia and hypertension. Former smoker. EXAM: CHEST  2 VIEW COMPARISON:  12/03/2015; 04/29/2014; 04/07/2013 FINDINGS: Grossly unchanged enlarged cardiac silhouette and mediastinal contours given slightly reduced lung volumes. Worsening bibasilar opacities favored to represent atelectasis. No discrete focal airspace opacities. No pleural effusion or pneumothorax. No evidence of evidence of edema. No acute osseous abnormalities. IMPRESSION: Minimal bibasilar atelectasis without acute cardiopulmonary disease. Electronically Signed   By: Simonne Come M.D.   On: 01/29/2016 13:57   Ct Head Wo Contrast  Result Date: 01/29/2016 CLINICAL DATA:  Disorientation. EXAM: CT HEAD WITHOUT CONTRAST  TECHNIQUE: Contiguous axial images were obtained from the base of the skull through the vertex without intravenous contrast. COMPARISON:  12/03/2015 FINDINGS: Brain: No evidence of acute infarction, hemorrhage, hydrocephalus, extra-axial collection or mass lesion/mass effect. Moderate to advanced microvascular disease of the deep white matter and brain parenchymal volume loss. Vascular: No hyperdense vessel. Calcific atherosclerotic disease at the skullbase. Skull: Normal. Negative for fracture or focal lesion. Sinuses/Orbits: No acute finding. Other: None. IMPRESSION: No acute intracranial abnormality. Atrophy, chronic microvascular disease. Electronically Signed   By: Ted Mcalpineobrinka  Dimitrova M.D.   On: 01/29/2016 14:10     ____________________________________________   PROCEDURES  Procedure(s) performed: None  Procedures  Critical Care performed: No ____________________________________________   INITIAL IMPRESSION / ASSESSMENT AND PLAN / ED COURSE  Pertinent labs & imaging results that were available during my care of the patient were reviewed by me and considered in my medical decision making (see chart for details).  82 y.o. female with a history of dementia, COPD and asthma, presenting with 1 week of cough and altered mental status. Overall, the patient has reassuring vital signs; she is not tachycardic, hypotensive, or febrile here. Her oxygen saturations are reassuring but she does have Rales in the right lung. We'll get a chest x-ray to evaluate for pneumonia. We'll get basic blood work, including urinalysis. Given that she is unable to give a full history, I'll get a CT scan for the complaint of altered mental status as well. Plan reevaluation for final disposition.  ----------------------------------------- 3:01 PM on 01/29/2016 -----------------------------------------  The patient continues to have reassuring vital signs. Her chest x-ray shows bibasilar atelectasis, but she does have a significant cough, as well as an elevated white blood cell count, and I'm concerned there is a radiographic lag for pneumonia here. I will treat the patient with Levaquin, admit her to the hospital for further evaluation and treatment. ____________________________________________  FINAL CLINICAL IMPRESSION(S) / ED DIAGNOSES  Final diagnoses:  Healthcare-associated pneumonia  Altered mental status, unspecified altered mental status type    Clinical Course       NEW MEDICATIONS STARTED DURING THIS VISIT:  New Prescriptions   No medications on file       Rockne MenghiniAnne-Caroline Erick Murin, MD 01/29/16 1502

## 2016-01-29 NOTE — ED Triage Notes (Signed)
Pt presents to ED from [facility] via [facility] c/o [facility]. EMS report they were called by family because pt "can't remember to swallow or use bathroom." Pt oriented to person, place, and situation only. When asked the date pt states it's 1929. No c/o pain. Noted wet cough.

## 2016-01-30 LAB — COMPREHENSIVE METABOLIC PANEL
ALBUMIN: 3.1 g/dL — AB (ref 3.5–5.0)
ALK PHOS: 54 U/L (ref 38–126)
ALT: 16 U/L (ref 14–54)
AST: 21 U/L (ref 15–41)
Anion gap: 6 (ref 5–15)
BUN: 21 mg/dL — AB (ref 6–20)
CALCIUM: 9 mg/dL (ref 8.9–10.3)
CO2: 26 mmol/L (ref 22–32)
CREATININE: 0.62 mg/dL (ref 0.44–1.00)
Chloride: 108 mmol/L (ref 101–111)
GFR calc Af Amer: >60 mL/min (ref >60)
GFR calc non Af Amer: >60 mL/min (ref >60)
GLUCOSE: 164 mg/dL — AB (ref 65–99)
Potassium: 4.1 mmol/L (ref 3.5–5.1)
SODIUM: 140 mmol/L (ref 135–145)
Total Bilirubin: 0.8 mg/dL (ref 0.3–1.2)
Total Protein: 6.2 g/dL — ABNORMAL LOW (ref 6.5–8.1)

## 2016-01-30 LAB — CBC
HCT: 34 % — ABNORMAL LOW (ref 35.0–47.0)
Hemoglobin: 11.3 g/dL — ABNORMAL LOW (ref 12.0–16.0)
MCH: 31.8 pg (ref 26.0–34.0)
MCHC: 33.3 g/dL (ref 32.0–36.0)
MCV: 95.5 fL (ref 80.0–100.0)
PLATELETS: 176 10*3/uL (ref 150–440)
RBC: 3.57 MIL/uL — ABNORMAL LOW (ref 3.80–5.20)
RDW: 12.9 % (ref 11.5–14.5)
WBC: 11.7 10*3/uL — ABNORMAL HIGH (ref 3.6–11.0)

## 2016-01-30 LAB — URINE CULTURE: CULTURE: NO GROWTH

## 2016-01-30 MED ORDER — ENOXAPARIN SODIUM 40 MG/0.4ML ~~LOC~~ SOLN
40.0000 mg | SUBCUTANEOUS | Status: DC
  Administered 2016-01-30 – 2016-01-31 (×2): 40 mg via SUBCUTANEOUS
  Filled 2016-01-30 (×2): qty 0.4

## 2016-01-30 NOTE — Clinical Social Work Placement (Deleted)
   CLINICAL SOCIAL WORK PLACEMENT  NOTE  Date:  01/30/2016  Patient Details  Name: Donna Goodman MRN: [id number] Date of Birth: 26-Feb-1927  Clinical Social Work is seeking post-discharge placement for this patient at the Skilled  Nursing Facility level of care (*CSW will initial, date and re-position this form in  chart as items are completed):  Yes   Patient/family provided with [place] Clinical Social Work Department's list of facilities offering this level of care within the geographic area requested by the patient (or if unable, by the patient's family).  Yes   Patient/family informed of their freedom to choose among providers that offer the needed level of care, that participate in Medicare, Medicaid or managed care program needed by the patient, have an available bed and are willing to accept the patient.  Yes   Patient/family informed of [place]'s ownership interest in [facility] and [facility], as well as of the fact that they are under no obligation to receive care at these facilities.  PASRR submitted to EDS on 01/30/16 (Patient had an existing ALF [facility]. SNF PAS[facility] was submitted today. )     PASRR number received on       Existing PASRR number confirmed on       FL2 transmitted to all facilities in geographic area requested by pt/family on 01/30/16     FL2 transmitted to all facilities within larger geographic area on       Patient informed that his/her managed care company has contracts with or will negotiate with certain facilities, including the following:            Patient/family informed of bed offers received.  Patient chooses bed at       Physician recommends and patient chooses bed at      Patient to be transferred to   on  .  Patient to be transferred to facility by       Patient family notified on   of transfer.  Name of family member notified:        PHYSICIAN       Additional Comment:     _______________________________________________ Blasa Raisch, Darleen CrockerBailey M, LCSW 01/30/2016, 2:43 PM

## 2016-01-30 NOTE — Evaluation (Signed)
Physical Therapy Evaluation Patient Details Name: Donna Goodman N [place] MRN: [id number] DOB: 1927/04/12 Today's Date: 01/30/2016   History of Present Illness  Pt is a 82 y/o F who presents to ALF with worsening cough, confusion, and SOB.  CXR suggests bibasilar opacities worrisome for pneumonia.  Pt's PMH includes dementia, COPD, back surgery.      Clinical Impression  Pt admitted with above diagnosis. Pt currently with functional limitations due to the deficits listed below (see PT Problem List). Ms. Donna Goodman presents with BUE/LE tremor at rest and with activity and it is unclear if this is her baseline as it is not documented in PMH with chart review.  She requires min assist for transfer and ambulation due to unsteadiness with SpO2 dropping as low as 85% on RA while ambulating, which quickly recovers to 92% at rest, RN notified.  Additionally, pt reported numbness/stiffness R middle finger and RN notified of this as well, dermatomal testing WNL.  Unsure of pt's PLOF or home layout but suspect she needed assist with ADLs at her ALF.  She will need 24/7 supervision/assist at d/c and therefore recommending SNF.  Pt will benefit from skilled PT to increase their independence and safety with mobility to allow discharge to the venue listed below.      Follow Up Recommendations SNF    Equipment Recommendations  None recommended by PT (per pt, she has a walker at home)    Recommendations for Other Services       Precautions / Restrictions Precautions Precautions: Fall Restrictions Weight Bearing Restrictions: No      Mobility  Bed Mobility Overal bed mobility: Needs Assistance Bed Mobility: Supine to Sit     Supine to sit: Min guard;HOB elevated     General bed mobility comments: Increased time and effort.  Pt attempts to pull on RW to elevate trunk and requires cues for safe technique.  Transfers Overall transfer level: Needs assistance Equipment used: Rolling walker (2  wheeled) Transfers: Sit to/from Stand Sit to Stand: Min assist         General transfer comment: Min assist due to instability and cues for correct hand placement.  Ambulation/Gait Ambulation/Gait assistance: Min assist Ambulation Distance (Feet): 80 Feet Assistive device: Rolling walker (2 wheeled) Gait Pattern/deviations: Step-through pattern;Decreased stride length;Antalgic;Trunk flexed Gait velocity: decreased Gait velocity interpretation: Below normal speed for age/gender General Gait Details: Cues for proper management of RW as pt pushing RW too far ahead.  Unsteadiness due to tremors.  SpO2 drops as low as 85% on RA which recovers quickly to 92% at rest.    Stairs            Wheelchair Mobility    Modified Rankin (Stroke Patients Only)       Balance Overall balance assessment: Needs assistance Sitting-balance support: No upper extremity supported;Feet supported Sitting balance-Leahy Scale: Fair     Standing balance support: Bilateral upper extremity supported;During functional activity Standing balance-Leahy Scale: Poor Standing balance comment: Relies on RW for support                             Pertinent Vitals/Pain Pain Assessment: No/denies pain    Home Living Family/patient expects to be discharged to:: Skilled nursing facility                 Additional Comments: Pt is from an ALF    Prior Function Level of Independence: Needs assistance   Gait / Transfers  Assistance Needed: Pt reports she ambulates with a 4WW at all times and denies any recent falls.    ADL's / Homemaking Assistance Needed: Pt reports she takes a sponge bath and then later says she gets in the tub and that she doesn't need help with bathing.  Pt likely required assist PTA with ADLs.        Hand Dominance   Dominant Hand: Right    Extremity/Trunk Assessment   Upper Extremity Assessment: Generalized weakness (BUE/LE tremor at rest and with activity)            Lower Extremity Assessment: Generalized weakness (BUE/LE tremor at rest and with activity)      Cervical / Trunk Assessment: Kyphotic  Communication   Communication: No difficulties  Cognition Arousal/Alertness: Awake/alert Behavior During Therapy: WFL for tasks assessed/performed Overall Cognitive Status: History of cognitive impairments - at baseline                 General Comments: Pt able to report name, DOB, "I think we're in [facility]".  Pt unable to report the date.      General Comments General comments (skin integrity, edema, etc.): At start of session pt reports numbness and stiffness while pointint to her R middle finger.  RN notified. WNL BUE dermatomal testing.  Pt received not on O2. While ambulating SpO2 as low as 85% on RA which improves quickly to 92% at rest.  RN notified.    Exercises     Assessment/Plan    PT Assessment Patient needs continued PT services  PT Problem List Decreased strength;Decreased activity tolerance;Decreased balance;Decreased mobility;Decreased cognition;Decreased knowledge of use of DME;Decreased safety awareness;Cardiopulmonary status limiting activity          PT Treatment Interventions DME instruction;Gait training;Functional mobility training;Therapeutic activities;Therapeutic exercise;Balance training;Cognitive remediation;Patient/family education    PT Goals (Current goals can be found in the Care Plan section)  Acute Rehab PT Goals Patient Stated Goal: to go home PT Goal Formulation: With patient Time For Goal Achievement: 02/13/16 Potential to Achieve Goals: Good    Frequency Min 2X/week   Barriers to discharge Decreased caregiver support From ALF, pt will need 24/7 supervision/assist at d/c    Co-evaluation               End of Session Equipment Utilized During Treatment: Gait belt Activity Tolerance: Patient tolerated treatment well Patient left: in chair;with call  bell/phone within reach;with chair alarm set Nurse Communication: Mobility status;Other (comment) (SpO2, numbness/stiffness R middle finger)         Time: 1002-1036 PT Time Calculation (min) (ACUTE ONLY): 34 min   Charges:   PT Evaluation $PT Eval Low Complexity: 1 Procedure PT Treatments $Gait Training: 8-22 mins   PT G Codes:        Encarnacion ChuAshley Abashian PT, DPT 01/30/2016, 10:52 AM

## 2016-01-30 NOTE — Clinical Social Work Note (Signed)
Clinical Social Work Assessment  Patient Details  Name: Donna Goodman MRN: [id number] Date of Birth: Nov 06, 1927  Date of referral:  01/30/16               Reason for consult:  Facility Placement                Permission sought to share information with:  Oceanographeracility Contact Representative Permission granted to share information::  Yes, Verbal Permission Granted  Name::      [facility]   Agency::     Relationship::     Contact Information:     Housing/Transportation Living arrangements for the past 2 months:  Assisted Living Facility Source of Information:  Adult Children Patient Interpreter Needed:  None Criminal Activity/Legal Involvement Pertinent to Current Situation/Hospitalization:  No - Comment as needed Significant Relationships:  Adult Children, Other Family Members Lives with:  Facility Resident Do you feel safe going back to the place where you live?  Yes Need for family participation in patient care:  Yes (Comment)  Care giving concerns:  Patient has been at resident at [facility] since 07/02/2014.    Social Worker assessment / plan:  Visual merchandiserClinical Social Worker (CSW) received SNF consult. PT is recommending SNF. CSW contacted Personnel officerLaverne administrator at [facility] and made her aware of above. Per Laverne patient [facility] and have home health PT. CSW contacted patient's brother Dannielle HuhDanny and Basil DessCecile and made them aware of above. Per Alfredo Bachecil he is patient's HPOA and would like for patient to return to [facility] regardless of what PT is recommending. Patient did walk 80 feet with PT today. CSW also spoke with Med Cent[employer] Corporationech Belinda at [facility]. Per Med Tech patient is on room air and walks with walker at baseline. CSW attempted to meet with patient however she was pleasantly confused. Per Alfredo Bachecil family will transport patient back to [facility] when she is stable for D/C. CSW will continue to follow and assist as needed.   Employment status:   Retired Database administratornsurance information:  Managed Medicare PT Recommendations:  Skilled Nursing Facility Information / Referral to community resources:  Skilled Nursing Facility  Patient/Family's Response to care:  Patient's family would like for her to return to [facility].   Patient/Family's Understanding of and Emotional Response to Diagnosis, Current Treatment, and Prognosis:  Patient's brothers were very pleasant and thanked CSW for assistance.   Emotional Assessment Appearance:  Appears stated age Attitude/Demeanor/Rapport:    Affect (typically observed):  Pleasant Orientation:  Oriented to Self, Oriented to Place, Fluctuating Orientation (Suspected and/or reported Sundowners) Alcohol / Substance use:  Not Applicable Psych involvement (Current and /or in the community):  No (Comment)  Discharge Needs  Concerns to be addressed:  Discharge Planning Concerns Readmission within the last 30 days:  No Current discharge risk:  Chronically ill, Cognitively Impaired, Dependent with Mobility Barriers to Discharge:  Continued Medical Work up   [employer], Darleen CrockerBailey M, LCSW 01/30/2016, 2:57 PM

## 2016-01-30 NOTE — [place] FL2 (Signed)
[place] MEDICAID FL2 LEVEL OF CARE SCREENING TOOL     IDENTIFICATION  Patient Name: Donna Goodman Birthdate: 30-Oct-1927 Sex: female Admission Date (Current Location): 01/29/2016  [place]   Facility and Address:  [facility], [address], [place], [place] [address]      Provider Number: [id number]  Attending Physician Name and Address:  Katha HammingSnehalatha Konidena, MD  Relative Name and Phone Number:       Current Level of Care: Hospital Recommended Level of Care: ALF  Prior Approval Number:    Date Approved/Denied:   PASRR Number:   [id number]   Discharge Plan: ALF     Current Diagnoses: Patient Active Problem List   Diagnosis Date Noted  . Multifocal pneumonia 01/29/2016  . SOB (shortness of breath) 01/29/2016  . Cough, persistent 01/29/2016  . COPD with acute exacerbation (HCC) 01/29/2016    Orientation RESPIRATION BLADDER Height & Weight     Self, Place  Normal Incontinent Weight: 121 lb 3.2 oz (55 kg) Height:  5\' 2"  (157.5 cm)  BEHAVIORAL SYMPTOMS/MOOD NEUROLOGICAL BOWEL NUTRITION STATUS   (none)  (none) Continent Diet (Diet: 2 Grams Sodium. )  AMBULATORY STATUS COMMUNICATION OF NEEDS Skin   Limited Assist  Verbally Normal                       Personal Care Assistance Level of Assistance  Bathing, Feeding, Dressing Bathing Assistance: Limited assistance Feeding assistance: Independent Dressing Assistance: Limited assistance     Functional Limitations Info  Sight, Hearing, Speech Sight Info: Adequate Hearing Info: Adequate Speech Info: Adequate    SPECIAL CARE FACTORS FREQUENCY  PT (By licensed PT), OT (By licensed OT)     2-3 days per week via home health              Contractures      Additional Factors Info  Code Status, Allergies Code Status Info:  (Full Code. ) Allergies Info:  (No Known Allergies. )           Current Medications (01/30/2016):  This is the  current hospital active medication list Current Facility-Administered Medications  Medication Dose Route Frequency Provider Last Rate Last Dose  . 0.9 % NaCl with KCl 20 mEq/ L  infusion   Intravenous Continuous Marguarite ArbourJeffrey D Sparks, MD 75 mL/hr at 01/29/16 1824    . acetaminophen (TYLENOL) tablet 650 mg  650 mg Oral Q6H PRN Marguarite ArbourJeffrey D Sparks, MD       Or  . acetaminophen (TYLENOL) suppository 650 mg  650 mg Rectal Q6H PRN Marguarite ArbourJeffrey D Sparks, MD      . albuterol (PROVENTIL) (2.5 MG/3ML) 0.083% nebulizer solution 2.5 mg  2.5 mg Nebulization Q4H PRN Marguarite ArbourJeffrey D Sparks, MD      . aspirin EC tablet 81 mg  81 mg Oral Daily Marguarite ArbourJeffrey D Sparks, MD   81 mg at 01/30/16 0946  . atorvastatin (LIPITOR) tablet 20 mg  20 mg Oral QHS Marguarite ArbourJeffrey D Sparks, MD   20 mg at 01/29/16 2057  . bisacodyl (DULCOLAX) suppository 10 mg  10 mg Rectal Daily PRN Marguarite ArbourJeffrey D Sparks, MD      . diclofenac sodium (VOLTAREN) 1 % transdermal gel 2 g  2 g Topical Daily PRN Marguarite ArbourJeffrey D Sparks, MD      . diclofenac sodium (VOLTAREN) 1 % transdermal gel 2 g  2 g Topical Daily PRN Marguarite ArbourJeffrey D Sparks, MD      .  divalproex (DEPAKOTE) DR tablet 125 mg  125 mg Oral TID Marguarite ArbourJeffrey D Sparks, MD   125 mg at 01/30/16 1043  . docusate sodium (COLACE) capsule 100 mg  100 mg Oral BID Marguarite ArbourJeffrey D Sparks, MD   100 mg at 01/30/16 0946  . donepezil (ARICEPT) tablet 5 mg  5 mg Oral QHS Marguarite ArbourJeffrey D Sparks, MD   5 mg at 01/29/16 2057  . enoxaparin (LOVENOX) injection 40 mg  40 mg Subcutaneous Q24H Katha HammingSnehalatha Konidena, MD      . fluticasone (FLONASE) 50 MCG/ACT nasal spray 2 spray  2 spray Each Nare Daily Marguarite ArbourJeffrey D Sparks, MD   2 spray at 01/30/16 0946  . guaiFENesin (MUCINEX) 12 hr tablet 600 mg  600 mg Oral BID Marguarite ArbourJeffrey D Sparks, MD   600 mg at 01/30/16 0946  . guaiFENesin-dextromethorphan (ROBITUSSIN DM) 100-10 MG/5ML syrup 5 mL  5 mL Oral Q4H PRN Marguarite ArbourJeffrey D Sparks, MD      . ibuprofen (ADVIL,MOTRIN) tablet 600 mg  600 mg Oral Q6H PRN Marguarite ArbourJeffrey D Sparks, MD   600 mg at 01/29/16 1635   . ipratropium-albuterol (DUONEB) 0.5-2.5 (3) MG/3ML nebulizer solution 3 mL  3 mL Nebulization QID Marguarite ArbourJeffrey D Sparks, MD   3 mL at 01/30/16 1156  . levothyroxine (SYNTHROID, LEVOTHROID) tablet 75 mcg  75 mcg Oral QAC breakfast Marguarite ArbourJeffrey D Sparks, MD   75 mcg at 01/30/16 0818  . loratadine (CLARITIN) tablet 10 mg  10 mg Oral Daily Marguarite ArbourJeffrey D Sparks, MD   10 mg at 01/30/16 0946  . LORazepam (ATIVAN) tablet 0.5 mg  0.5 mg Oral Q6H PRN Marguarite ArbourJeffrey D Sparks, MD      . methylPREDNISolone sodium succinate (SOLU-MEDROL) 125 mg/2 mL injection 60 mg  60 mg Intravenous Q12H Marguarite ArbourJeffrey D Sparks, MD   60 mg at 01/30/16 0217  . metoprolol succinate (TOPROL-XL) 24 hr tablet 25 mg  25 mg Oral Daily Marguarite ArbourJeffrey D Sparks, MD   25 mg at 01/30/16 0946  . mometasone-formoterol (DULERA) 100-5 MCG/ACT inhaler 2 puff  2 puff Inhalation BID Marguarite ArbourJeffrey D Sparks, MD   2 puff at 01/30/16 [phone]  . multivitamin with minerals tablet 1 tablet  1 tablet Oral Daily Marguarite ArbourJeffrey D Sparks, MD   1 tablet at 01/30/16 0946  . ondansetron (ZOFRAN) tablet 4 mg  4 mg Oral Q6H PRN Marguarite ArbourJeffrey D Sparks, MD       Or  . ondansetron [facility](ZOFRAN) injection 4 mg  4 mg Intravenous Q6H PRN Marguarite ArbourJeffrey D Sparks, MD      . pantoprazole (PROTONIX) EC tablet 40 mg  40 mg Oral Daily Marguarite ArbourJeffrey D Sparks, MD   40 mg at 01/30/16 0946  . piperacillin-tazobactam (ZOSYN) IVPB 3.375 g  3.375 g Intravenous Q8H Marguarite ArbourJeffrey D Sparks, MD 12.5 mL/hr at 01/30/16 0505 3.375 g at 01/30/16 0505  . sertraline (ZOLOFT) tablet 50 mg  50 mg Oral Daily Marguarite ArbourJeffrey D Sparks, MD   50 mg at 01/30/16 0946  . topiramate (TOPAMAX) tablet 25 mg  25 mg Oral QHS Marguarite ArbourJeffrey D Sparks, MD   25 mg at 01/29/16 2056     Discharge Medications: Please see discharge summary for a list of discharge medications.  Relevant Imaging Results:  Relevant Lab Results:   Additional Information  (SSN: [id number])  Jillian Pianka, Darleen CrockerBailey M, LCSW

## 2016-01-30 NOTE — Progress Notes (Signed)
[facility] [place] [facility]   PATIENT NAME: Mariana Kaufmanmma Lineman    MR#:  [id number]  DATE OF BIRTH:  [birth year]  SUBJECTIVE:seen today,admitted for pneumonia.she says she feels better,  CHIEF COMPLAINT:   Chief Complaint  Patient presents with  . Altered Mental Status    REVIEW OF SYSTEMS:   ROS CONSTITUTIONAL: No fever, fatigue or weakness.  EYES: No blurred or double vision.  EARS, NOSE, AND THROAT: No tinnitus or ear pain.  RESPIRATORY: No cough, shortness of breath, wheezing or hemoptysis.  CARDIOVASCULAR: No chest pain, orthopnea, edema.  GASTROINTESTINAL: No nausea, vomiting, diarrhea or abdominal pain.  GENITOURINARY: No dysuria, hematuria.  ENDOCRINE: No polyuria, nocturia,  HEMATOLOGY: No anemia, easy bruising or bleeding SKIN: No rash or lesion. MUSCULOSKELETAL: No joint pain or arthritis.   NEUROLOGIC: No tingling, numbness, weakness.  PSYCHIATRY: No anxiety or depression.   DRUG ALLERGIES:  No Known Allergies  VITALS:  Blood pressure (!) 158/68, pulse 85, temperature 97.9 F (36.6 C), temperature source Oral, resp. rate 19, height 5\' 2"  (1.575 m), weight 55 kg (121 lb 3.2 oz), SpO2 95 %.  PHYSICAL EXAMINATION:  GENERAL:  82 y.o.-year-old patient lying in the bed with no acute distress.  EYES: Pupils equal, round, reactive to light and accommodation. No scleral icterus. Extraocular muscles intact.  HEENT: Head atraumatic, normocephalic. Oropharynx and nasopharynx clear.  NECK:  Supple, no jugular venous distention. No thyroid enlargement, no tenderness.  LUNGS: decreased breath  Sounds present.no rales,rhonchi or crepitation. No use of accessory muscles of respiration.  CARDIOVASCULAR: S1, S2 normal. No murmurs, rubs, or gallops.  ABDOMEN: Soft, nontender, nondistended. Bowel sounds present. No organomegaly or mass.  EXTREMITIES: No pedal edema, cyanosis, or clubbing.  NEUROLOGIC: Cranial nerves II through XII are intact. Muscle  strength 5/5 in all extremities. Sensation intact. Gait not checked.  PSYCHIATRIC: The patient is alert and oriented x 3.  SKIN: No obvious rash, lesion, or ulcer.    LABORATORY PANEL:   CBC  Recent Labs Lab 01/30/16 0321  WBC 11.7*  HGB 11.3*  HCT 34.0*  PLT 176   ------------------------------------------------------------------------------------------------------------------  Chemistries   Recent Labs Lab 01/30/16 0321  NA 140  K 4.1  CL 108  CO2 26  GLUCOSE 164*  BUN 21*  CREATININE 0.62  CALCIUM 9.0  AST 21  ALT 16  ALKPHOS 54  BILITOT 0.8   ------------------------------------------------------------------------------------------------------------------  Cardiac Enzymes  Recent Labs Lab 01/29/16 1330  TROPONINI <0.03   ------------------------------------------------------------------------------------------------------------------  RADIOLOGY:  Dg Chest 2 View  Result Date: 01/29/2016 CLINICAL DATA:  Altered mental status. History of asthma, COPD, dementia and hypertension. Former smoker. EXAM: CHEST  2 VIEW COMPARISON:  12/03/2015; 04/29/2014; 04/07/2013 FINDINGS: Grossly unchanged enlarged cardiac silhouette and mediastinal contours given slightly reduced lung volumes. Worsening bibasilar opacities favored to represent atelectasis. No discrete focal airspace opacities. No pleural effusion or pneumothorax. No evidence of evidence of edema. No acute osseous abnormalities. IMPRESSION: Minimal bibasilar atelectasis without acute cardiopulmonary disease. Electronically Signed   By: Simonne ComeJohn  Watts M.D.   On: 01/29/2016 13:57   Ct Head Wo Contrast  Result Date: 01/29/2016 CLINICAL DATA:  Disorientation. EXAM: CT HEAD WITHOUT CONTRAST TECHNIQUE: Contiguous axial images were obtained from the base of the skull through the vertex without intravenous contrast. COMPARISON:  12/03/2015 FINDINGS: Brain: No evidence of acute infarction, hemorrhage, hydrocephalus,  extra-axial collection or mass lesion/mass effect. Moderate to advanced microvascular disease of the deep white matter and brain parenchymal volume loss. Vascular: No  hyperdense vessel. Calcific atherosclerotic disease at the skullbase. Skull: Normal. Negative for fracture or focal lesion. Sinuses/Orbits: No acute finding. Other: None. IMPRESSION: No acute intracranial abnormality. Atrophy, chronic microvascular disease. Electronically Signed   By: Ted Mcalpineobrinka  Dimitrova M.D.   On: 01/29/2016 14:10   Ct Chest Wo Contrast  Result Date: 01/29/2016 CLINICAL DATA:  Nonproductive cough for 1 month. EXAM: CT CHEST WITHOUT CONTRAST TECHNIQUE: Multidetector CT imaging of the chest was performed following the standard protocol without IV contrast. COMPARISON:  Chest x-ray today and 12/03/2015. FINDINGS: Cardiovascular: Mild cardiomegaly. Calcified plaque over the lateral circumflex and right coronary arteries. Calcified plaque over the thoracic aorta. Mediastinum/Nodes: No definite hilar or mediastinal adenopathy. The remaining mediastinal structures are unremarkable. Lungs/Pleura: Lungs are adequately inflated and demonstrate patchy airspace consolidation over the right lower lobe and minimally right middle lobe likely pneumonia. Tiny right effusion. Mild right apical pleural thickening. Left lung is clear. Obstruction of distal right lower lobe bronchi which may be the result of aspiration versus mucous plugging. Suggestion of a tiny amount of aspirate material over the left side of the trachea. Upper Abdomen: Calcified plaque over the abdominal aorta. Musculoskeletal: Mild degenerate change of the spine. Two adjacent mild compression deformities over the mid thoracic spine unchanged. IMPRESSION: Airspace consolidation over the right lower lobe and minimally involving the right middle lobe compatible with a pneumonia. Consider possible aspiration as etiology as there is obstruction of distal right lower lobe bronchus  which may be due to mucous plugging versus aspiration. Small associated right pleural effusion. Cardiomegaly and evidence of atherosclerotic coronary artery disease. Aortic atherosclerosis. Two adjacent mild compression deformities over the mid thoracic spine unchanged. Electronically Signed   By: Elberta Fortisaniel  Boyle M.D.   On: 01/29/2016 15:54    EKG:   Orders placed or performed during the hospital encounter of 01/29/16  . EKG 12-Lead  . EKG 12-Lead    ASSESSMENT AND PLAN:  1.multifocal pneumonia;on iv abx.feeling better.continue IV abx today. confusion;metabolic encephalopathy;stable.alert,awake now. 2.copd;stable 3.htn;stable 4.dementia; 5.dispo:SNF D/w family  All the records are reviewed and case discussed with Care Management/Social Workerr. Management plans discussed with the patient, family and they are in agreement.  CODE STATUS:stable  TOTAL TIME TAKING CARE OF THIS PATIENT: 35  minutes.   POSSIBLE D/C IN 1-2DAYS, DEPENDING ON CLINICAL CONDITION.   Katha HammingKONIDENA,Paislynn Hegstrom M.D on 01/30/2016 at 9:52 PM  Between 7am to 6pm - Pager - [phone]  After 6pm go to [email or web address] - password [email or web address]  [facility]  Office  [phone]  CC: Primary care physician; Pcp Not In System   Note: This dictation was prepared with Dragon dictation along with smaller phrase technology. Any transcriptional errors that result from this process are unintentional.

## 2016-01-31 MED ORDER — IPRATROPIUM-ALBUTEROL 0.5-2.5 (3) MG/3ML IN SOLN
3.0000 mL | Freq: Three times a day (TID) | RESPIRATORY_TRACT | Status: DC
  Administered 2016-01-31 – 2016-02-01 (×3): 3 mL via RESPIRATORY_TRACT
  Filled 2016-01-31 (×4): qty 3

## 2016-01-31 NOTE — Progress Notes (Signed)
[facility] [place] [facility]   PATIENT NAME: Donna Goodman    MR#:  [id number]  DATE OF BIRTH:  07/14/1927  SUBJECTIVe;seen at bedside.has some cough.other wise feels better.has some head shaking due to parkinson disease,  CHIEF COMPLAINT:   Chief Complaint  Patient presents with  . Altered Mental Status    REVIEW OF SYSTEMS:   ROS CONSTITUTIONAL: No fever, fatigue or weakness.  EYES: No blurred or double vision.  EARS, NOSE, AND THROAT: No tinnitus or ear pain.  RESPIRATORY: No cough, shortness of breath, wheezing or hemoptysis.  CARDIOVASCULAR: No chest pain, orthopnea, edema.  GASTROINTESTINAL: No nausea, vomiting, diarrhea or abdominal pain.  GENITOURINARY: No dysuria, hematuria.  ENDOCRINE: No polyuria, nocturia,  HEMATOLOGY: No anemia, easy bruising or bleeding SKIN: No rash or lesion. MUSCULOSKELETAL;left hip surgery s/p honey comb dressing   NEUROLOGIC: No tingling, numbness, weakness.  PSYCHIATRY: No anxiety or depression.   DRUG ALLERGIES:  No Known Allergies  VITALS:  Blood pressure 104/71, pulse 84, temperature 98.6 F (37 C), temperature source Oral, resp. rate 19, height 5\' 2"  (1.575 m), weight 51.3 kg (113 lb 3.2 oz), SpO2 93 %.  PHYSICAL EXAMINATION:  GENERAL:  82 y.o.-year-old patient lying in the bed with no acute distress.  EYES: Pupils equal, round, reactive to light and accommodation. No scleral icterus. Extraocular muscles intact.  HEENT: Head atraumatic, normocephalic. Oropharynx and nasopharynx clear.  NECK:  Supple, no jugular venous distention. No thyroid enlargement, no tenderness.  LUNGS: decreased breath  Sounds present.no rales,rhonchi or crepitation. No use of accessory muscles of respiration.  CARDIOVASCULAR: S1, S2 normal. No murmurs, rubs, or gallops.  ABDOMEN: Soft, nontender, nondistended. Bowel sounds present. No organomegaly or mass.  EXTREMITIES: No pedal edema, cyanosis, or clubbing.  NEUROLOGIC:  Cranial nerves II through XII are intact. Muscle strength 5/5 in all extremities. Sensation intact. Gait not checked.  PSYCHIATRIC: The patient is alert and oriented x 3.  SKIN: No obvious rash, lesion, or ulcer.    LABORATORY PANEL:   CBC  Recent Labs Lab 01/30/16 0321  WBC 11.7*  HGB 11.3*  HCT 34.0*  PLT 176   ------------------------------------------------------------------------------------------------------------------  Chemistries   Recent Labs Lab 01/30/16 0321  NA 140  K 4.1  CL 108  CO2 26  GLUCOSE 164*  BUN 21*  CREATININE 0.62  CALCIUM 9.0  AST 21  ALT 16  ALKPHOS 54  BILITOT 0.8   ------------------------------------------------------------------------------------------------------------------  Cardiac Enzymes  Recent Labs Lab 01/29/16 1330  TROPONINI <0.03   ------------------------------------------------------------------------------------------------------------------  RADIOLOGY:  No results found.  EKG:   Orders placed or performed during the hospital encounter of 01/29/16  . EKG 12-Lead  . EKG 12-Lead    ASSESSMENT AND PLAN:  1.multifocal pneumonia;on iv abx.feeling better.continue IV abx.no other complaints,due to still having significant cough,continue to montior closely and discharge home. Discharge tomorrow.   confusion;metabolic encephalopathy;stable.alert,awake now.  2.copd;stable  3.htn;stable  4.dementia ; 5.dispo:SNF  D/w family  All the records are reviewed and case discussed with Care Management/Social Workerr. Management plans discussed with the patient, family and they are in agreement.  CODE STATUS:stable  TOTAL TIME TAKING CARE OF THIS PATIENT: 35  minutes.   POSSIBLE D/C IN 1-2DAYS, DEPENDING ON CLINICAL CONDITION.   Katha HammingKONIDENA,Chigozie Basaldua M.D on 01/31/2016 at 8:29 PM  Between 7am to 6pm - Pager - [phone]  After 6pm go to [email or web address] - password [email or web address] [facility]  [facility]   Office  [phone]  CC: Primary care physician; Pcp  Not In System   Note: This dictation was prepared with Dragon dictation along with smaller phrase technology. Any transcriptional errors that result from this process are unintentional.

## 2016-01-31 NOTE — Progress Notes (Signed)
Spoke with Philis KendallSheen, [facility] rep at [phone], to notify of non-emergent EMS transport.  Auth notification reference given as [id number].   Service date range good from 01/31/16 - 04/30/16.   Geographical gap exception requested to determine if services can be considered at an in-network level.

## 2016-01-31 NOTE — Care Management (Addendum)
Case discussed with CSW. Family would like patient to return to [facility]  [facility]. TC to [facility], [phone]. To speak with Laverne.She states the facility uses [facility]. TC to brother, Titus MouldCecil Norris [phone]( [phone]), requested a return call.  Referral sent to [facility] for [facility] . It is anticipated patient may be ready for discharge on Wednesday.

## 2016-02-01 MED ORDER — AMOXICILLIN-POT CLAVULANATE 875-125 MG PO TABS: 1.0000 | ORAL_TABLET | Freq: Two times a day (BID) | ORAL | 0 refills | Status: DC

## 2016-02-01 MED ORDER — PREDNISONE 10 MG (21) PO TBPK: 10.0000 mg | ORAL_TABLET | Freq: Every day | ORAL | 0 refills | Status: DC

## 2016-02-01 MED ORDER — AZITHROMYCIN 500 MG PO TABS: 500.0000 mg | ORAL_TABLET | Freq: Every day | ORAL | 0 refills | Status: DC

## 2016-02-01 MED ORDER — GUAIFENESIN-DM 100-10 MG/5ML PO SYRP: 5.0000 mL | ORAL_SOLUTION | ORAL | 0 refills | Status: DC | PRN

## 2016-02-01 MED ORDER — LORAZEPAM 0.5 MG PO TABS: 0.2500 mg | ORAL_TABLET | Freq: Every day | ORAL | 0 refills | Status: DC | PRN

## 2016-02-01 MED ORDER — IPRATROPIUM-ALBUTEROL 0.5-2.5 (3) MG/3ML IN SOLN: 3.0000 mL | Freq: Three times a day (TID) | RESPIRATORY_TRACT | 1 refills | Status: DC

## 2016-02-01 MED ORDER — ALBUTEROL SULFATE HFA 108 (90 BASE) MCG/ACT IN AERS: 2.0000 | INHALATION_SPRAY | Freq: Four times a day (QID) | RESPIRATORY_TRACT | 2 refills | Status: DC | PRN

## 2016-02-01 NOTE — [place] FL2 (Signed)
[place] MEDICAID FL2 LEVEL OF CARE SCREENING TOOL     IDENTIFICATION  Patient Name: Donna Goodman Birthdate: 1927-12-19 Sex: female Admission Date (Current Location): 01/29/2016  [place]   Facility and Address:  [facility], [address], [place], [place] [address]      Provider Number: [id number]  Attending Physician Name and Address:  Katha HammingSnehalatha Daltyn Degroat, MD  Relative Name and Phone Number:       Current Level of Care: Hospital Recommended Level of Care: ALF Prior Approval Number:    Date Approved/Denied:   PASRR Number:  [id number] [id number]  Discharge Plan: ALF    Current Diagnoses: Patient Active Problem List   Diagnosis Date Noted  . Multifocal pneumonia 01/29/2016  . SOB (shortness of breath) 01/29/2016  . Cough, persistent 01/29/2016  . COPD with acute exacerbation (HCC) 01/29/2016    Orientation RESPIRATION BLADDER Height & Weight     Self, Place  Normal Incontinent Weight: 117 lb (53.1 kg) Height:  5\' 2"  (157.5 cm)  BEHAVIORAL SYMPTOMS/MOOD NEUROLOGICAL BOWEL NUTRITION STATUS   (none)  (none) Continent Diet (Diet: 2 Grams Sodium. )  AMBULATORY STATUS COMMUNICATION OF NEEDS Skin   Extensive Assist Verbally Normal                       Personal Care Assistance Level of Assistance  Bathing, Feeding, Dressing Bathing Assistance: Limited assistance Feeding assistance: Independent Dressing Assistance: Limited assistance     Functional Limitations Info  Sight, Hearing, Speech Sight Info: Adequate Hearing Info: Adequate Speech Info: Adequate    SPECIAL CARE FACTORS FREQUENCY  PT (By licensed PT), OT (By licensed OT)     Home Health PT 2-3 days per week              Contractures      Additional Factors Info  Code Status, Allergies Code Status Info:  (Full Code. ) Allergies Info:  (No Known Allergies. )          Discharge Medications: Please see discharge summary for a list of  discharge medications. Medication List    TAKE these medications   acetaminophen 500 MG tablet Commonly known as:  TYLENOL Take 500 mg by mouth 3 (three) times daily.  acetaminophen 500 MG chewable tablet Commonly known as:  TYLENOL Chew 500 mg by mouth every 8 (eight) hours as needed for pain or fever.  albuterol (2.5 MG/3ML) 0.083% nebulizer solution Commonly known as:  PROVENTIL Take 2.5 mg by nebulization every 6 (six) hours as needed for wheezing or shortness of breath. What changed:  Another medication with the same name was added. Make sure you understand how and when to take each.  albuterol 108 (90 Base) MCG/ACT inhaler Commonly known as:  PROVENTIL HFA;VENTOLIN HFA Inhale 2 puffs into the lungs every 4 (four) hours as needed for wheezing. What changed:  Another medication with the same name was added. Make sure you understand how and when to take each.  albuterol 108 (90 Base) MCG/ACT inhaler Commonly known as:  PROVENTIL HFA;VENTOLIN HFA Inhale 2 puffs into the lungs every 6 (six) hours as needed for wheezing or shortness of breath. What changed:  You were already taking a medication with the same name, and this prescription was added. Make sure you understand how and when to take each.  alum & mag hydroxide-simeth 200-200-20 MG/5ML suspension Commonly known as:  MAALOX/MYLANTA Take 30 mLs by mouth 4 (four) times daily as needed  for indigestion or heartburn.  amoxicillin-clavulanate 875-125 MG tablet Commonly known as:  AUGMENTIN Take 1 tablet by mouth 2 (two) times daily.  aspirin EC 81 MG tablet Take 81 mg by mouth daily.  atorvastatin 20 MG tablet Commonly known as:  LIPITOR Take 20 mg by mouth at bedtime.  azithromycin 500 MG tablet Commonly known as:  ZITHROMAX Take 1 tablet (500 mg total) by mouth daily.  calcium citrate 950 MG tablet Commonly known as:  CALCITRATE - dosed in mg elemental calcium Take 100 mg of elemental calcium by mouth daily.  diclofenac  sodium 1 % Gel Commonly known as:  VOLTAREN Apply 2 g topically daily as needed. To right hip for pain  divalproex 125 MG DR tablet Commonly known as:  DEPAKOTE Take 125 mg by mouth 3 (three) times daily.  donepezil 5 MG tablet Commonly known as:  ARICEPT Take 5 mg by mouth at bedtime.  fluticasone 50 MCG/ACT nasal spray Commonly known as:  FLONASE Place 2 sprays into both nostrils daily.  Fluticasone-Salmeterol 100-50 MCG/DOSE Aepb Commonly known as:  ADVAIR Inhale 1 puff into the lungs 2 (two) times daily.  guaifenesin 100 MG/5ML syrup Commonly known as:  ROBITUSSIN Take 200 mg by mouth every 6 (six) hours as needed for cough.  guaiFENesin-dextromethorphan 100-10 MG/5ML syrup Commonly known as:  ROBITUSSIN DM Take 5 mLs by mouth every 4 (four) hours as needed for cough.  ibuprofen 400 MG tablet Commonly known as:  ADVIL,MOTRIN Take 400 mg by mouth 2 (two) times daily as needed for moderate pain.  ipratropium-albuterol 0.5-2.5 (3) MG/3ML Soln Commonly known as:  DUONEB Take 3 mLs by nebulization 3 (three) times daily.  levothyroxine 75 MCG tablet Commonly known as:  SYNTHROID, LEVOTHROID Take 75 mcg by mouth daily.  loperamide 2 MG capsule Commonly known as:  IMODIUM Take 2 mg by mouth as needed for diarrhea or loose stools.  loratadine 10 MG tablet Commonly known as:  CLARITIN Take 10 mg by mouth daily.  LORazepam 0.5 MG tablet Commonly known as:  ATIVAN Take 0.5 tablets (0.25 mg total) by mouth daily as needed for anxiety. What changed:  Another medication with the same name was removed. Continue taking this medication, and follow the directions you see here.  magnesium hydroxide 400 MG/5ML suspension Commonly known as:  MILK OF MAGNESIA Take 30 mLs by mouth at bedtime as needed for mild constipation.  metoprolol succinate 25 MG 24 hr tablet Commonly known as:  TOPROL-XL Take 25 mg by mouth daily.  multivitamin with minerals Tabs tablet Take 1 tablet by mouth daily.   neomycin-bacitracin-polymyxin ointment Commonly known as:  NEOSPORIN Apply 1 application topically as needed for wound care. apply to eye  predniSONE 10 MG (21) Tbpk tablet Commonly known as:  STERAPRED UNI-PAK 21 TAB Take 1 tablet (10 mg total) by mouth daily. Taper by 10 mg daily  sertraline 50 MG tablet Commonly known as:  ZOLOFT Take 50 mg by mouth daily.  topiramate 25 MG tablet Commonly known as:  TOPAMAX Take 25 mg by mouth at bedtime.  Vitamin D 2000 units Caps Take 2,000 Units by mouth daily.  Relevant Imaging Results: Relevant Lab Results: Additional Information  (SSN: [id number])  Sample, Darleen CrockerBailey M, LCSW

## 2016-02-01 NOTE — Progress Notes (Signed)
Shift assessment completed. Pt is easily awakened, in no distress, large tremors noted to pt's arms and hands when awakened, no tremors while asleep. Pt is oriented to self, is reoriented by this Clinical research associatewriter. Pt has loose cough, lungs are clear bilat, hr is regular, abdomen is soft, bs heard. Pt is wearing incontinence brief,ppp, no edema noted. PIV #20 intact to lac, site is free of redness and swelling. Since assessment, pt has been toileted to bedside commode for clear yellow urine, reoriented to call bell, and is eating breakfast at this time. Call bell in reach.

## 2016-02-01 NOTE — Discharge Summary (Signed)
Donna Goodman, is a 82 y.o. female  DOB February 12, 1928  MRN [id number].  Admission date:  01/29/2016  Admitting Physician  Marguarite ArbourJeffrey D Sparks, MD  Discharge Date:  02/01/2016   Primary MD  Pcp Not In System  Recommendations for primary care physician for things to follow:   Follow-up with primary doctor Dr. Einar CrowMarshall Anderson in 1 week   Admission Diagnosis  Healthcare-associated pneumonia [J18.9] Persistent cough [R05] Altered mental status, unspecified altered mental status type [R41.82]   Discharge Diagnosis  Healthcare-associated pneumonia [J18.9] Persistent cough [R05] Altered mental status, unspecified altered mental status type [R41.82]    Principal Problem:   Multifocal pneumonia Active Problems:   SOB (shortness of breath)   Cough, persistent   COPD with acute exacerbation [facility](HCC)      Past Medical History:  Diagnosis Date  . Asthma   . COPD (chronic obstructive pulmonary disease) (HCC)   . Dementia   . Hyperlipemia   . Hypertension     Past Surgical History:  Procedure Laterality Date  . BACK SURGERY         History of present illness and  Hospital Course:     Kindly see H&P for history of present illness and admission details, please review complete Labs, Consult reports and Test reports for all details in brief  HPI  from the history and physical done on the day of admission  [age] year old female patient with history of dementia, COPD, essential hypertension comes in from [facility] because of worsening cough, confusion, shortness of breath. Admitted because of pneumonia.  Hospital Course   1. multifocal pneumonia; started on Zosyn on admission. She also received IV steroids. CT chest showed consolidation of the right lower lobe, right middle lobe. And possible mucous plug in the right lower  lobe bronchus. Patient improved but still has lots of cough. But she became more alert and oriented. Able for discharge back to nursing home with the Augmentin, Zithromax. And continue Mucinex as needed for mucolytic effect. Blood cultures are negative, WBC improved from 14.4-11.7.2.The patient was counseled on the dangers of tobacco use, and was .stop Date on in;12/23 2.Parkinson dementia: 1. ON AND OFF, BASELINE DEMENTIA; continue Depakote, Septra, Zoloft, Topamax.  3 essential hypertension: Controlled #4. Cough;Patient is on Robitussin, Mucinex, continue them as needed. #3 HYPERLIPIDEMIA    Discharge Condition: stable   Follow UP      Discharge Instructions  and  Discharge Medications        Medication List    TAKE these medications   acetaminophen 500 MG tablet Commonly known as:  TYLENOL Take 500 mg by mouth 3 (three) times daily.   acetaminophen 500 MG chewable tablet Commonly known as:  TYLENOL Chew 500 mg by mouth every 8 (eight) hours as needed for pain or fever.   albuterol (2.5 MG/3ML) 0.083% nebulizer solution Commonly known as:  PROVENTIL Take 2.5 mg by nebulization every 6 (six) hours as needed for wheezing or shortness of breath. What changed:  Another medication with the same name was added. Make sure you understand how and when to take each.   albuterol 108 (90 Base) MCG/ACT inhaler Commonly known as:  PROVENTIL HFA;VENTOLIN HFA Inhale 2 puffs into the lungs every 4 (four) hours as needed for wheezing. What changed:  Another medication with the same name was added. Make sure you understand how and when to take each.   albuterol 108 (90 Base) MCG/ACT inhaler Commonly known as:  PROVENTIL HFA;VENTOLIN HFA Inhale 2  puffs into the lungs every 6 (six) hours as needed for wheezing or shortness of breath. What changed:  You were already taking a medication with the same name, and this prescription was added. Make sure you understand how and when to take  each.   alum & mag hydroxide-simeth 200-200-20 MG/5ML suspension Commonly known as:  MAALOX/MYLANTA Take 30 mLs by mouth 4 (four) times daily as needed for indigestion or heartburn.   amoxicillin-clavulanate 875-125 MG tablet Commonly known as:  AUGMENTIN Take 1 tablet by mouth 2 (two) times daily.   aspirin EC 81 MG tablet Take 81 mg by mouth daily.   atorvastatin 20 MG tablet Commonly known as:  LIPITOR Take 20 mg by mouth at bedtime.   azithromycin 500 MG tablet Commonly known as:  ZITHROMAX Take 1 tablet (500 mg total) by mouth daily.   calcium citrate 950 MG tablet Commonly known as:  CALCITRATE - dosed in mg elemental calcium Take 100 mg of elemental calcium by mouth daily.   diclofenac sodium 1 % Gel Commonly known as:  VOLTAREN Apply 2 g topically daily as needed. To right hip for pain   divalproex 125 MG DR tablet Commonly known as:  DEPAKOTE Take 125 mg by mouth 3 (three) times daily.   donepezil 5 MG tablet Commonly known as:  ARICEPT Take 5 mg by mouth at bedtime.   fluticasone 50 MCG/ACT nasal spray Commonly known as:  FLONASE Place 2 sprays into both nostrils daily.   Fluticasone-Salmeterol 100-50 MCG/DOSE Aepb Commonly known as:  ADVAIR Inhale 1 puff into the lungs 2 (two) times daily.   guaifenesin 100 MG/5ML syrup Commonly known as:  ROBITUSSIN Take 200 mg by mouth every 6 (six) hours as needed for cough.   guaiFENesin-dextromethorphan 100-10 MG/5ML syrup Commonly known as:  ROBITUSSIN DM Take 5 mLs by mouth every 4 (four) hours as needed for cough.   ibuprofen 400 MG tablet Commonly known as:  ADVIL,MOTRIN Take 400 mg by mouth 2 (two) times daily as needed for moderate pain.   ipratropium-albuterol 0.5-2.5 (3) MG/3ML Soln Commonly known as:  DUONEB Take 3 mLs by nebulization 3 (three) times daily.   levothyroxine 75 MCG tablet Commonly known as:  SYNTHROID, LEVOTHROID Take 75 mcg by mouth daily.   loperamide 2 MG capsule Commonly  known as:  IMODIUM Take 2 mg by mouth as needed for diarrhea or loose stools.   loratadine 10 MG tablet Commonly known as:  CLARITIN Take 10 mg by mouth daily.   LORazepam 0.5 MG tablet Commonly known as:  ATIVAN Take 0.5 tablets (0.25 mg total) by mouth daily as needed for anxiety. What changed:  Another medication with the same name was removed. Continue taking this medication, and follow the directions you see here.   magnesium hydroxide 400 MG/5ML suspension Commonly known as:  MILK OF MAGNESIA Take 30 mLs by mouth at bedtime as needed for mild constipation.   metoprolol succinate 25 MG 24 hr tablet Commonly known as:  TOPROL-XL Take 25 mg by mouth daily.   multivitamin with minerals Tabs tablet Take 1 tablet by mouth daily.   neomycin-bacitracin-polymyxin ointment Commonly known as:  NEOSPORIN Apply 1 application topically as needed for wound care. apply to eye   predniSONE 10 MG (21) Tbpk tablet Commonly known as:  STERAPRED UNI-PAK 21 TAB Take 1 tablet (10 mg total) by mouth daily. Taper by 10 mg daily   sertraline 50 MG tablet Commonly known as:  ZOLOFT Take 50 mg by  mouth daily.   topiramate 25 MG tablet Commonly known as:  TOPAMAX Take 25 mg by mouth at bedtime.   Vitamin D 2000 units Caps Take 2,000 Units by mouth daily.         Diet and Activity recommendation: See Discharge Instructions above   Consults obtained -social worker   Major procedures and Radiology Reports - PLEASE review detailed and final reports for all details, in brief -      Dg Chest 2 View  Result Date: 01/29/2016 CLINICAL DATA:  Altered mental status. History of asthma, COPD, dementia and hypertension. Former smoker. EXAM: CHEST  2 VIEW COMPARISON:  12/03/2015; 04/29/2014; 04/07/2013 FINDINGS: Grossly unchanged enlarged cardiac silhouette and mediastinal contours given slightly reduced lung volumes. Worsening bibasilar opacities favored to represent atelectasis. No discrete  focal airspace opacities. No pleural effusion or pneumothorax. No evidence of evidence of edema. No acute osseous abnormalities. IMPRESSION: Minimal bibasilar atelectasis without acute cardiopulmonary disease. Electronically Signed   By: Simonne Come M.D.   On: 01/29/2016 13:57   Ct Head Wo Contrast  Result Date: 01/29/2016 CLINICAL DATA:  Disorientation. EXAM: CT HEAD WITHOUT CONTRAST TECHNIQUE: Contiguous axial images were obtained from the base of the skull through the vertex without intravenous contrast. COMPARISON:  12/03/2015 FINDINGS: Brain: No evidence of acute infarction, hemorrhage, hydrocephalus, extra-axial collection or mass lesion/mass effect. Moderate to advanced microvascular disease of the deep white matter and brain parenchymal volume loss. Vascular: No hyperdense vessel. Calcific atherosclerotic disease at the skullbase. Skull: Normal. Negative for fracture or focal lesion. Sinuses/Orbits: No acute finding. Other: None. IMPRESSION: No acute intracranial abnormality. Atrophy, chronic microvascular disease. Electronically Signed   By: Ted Mcalpine M.D.   On: 01/29/2016 14:10   Ct Chest Wo Contrast  Result Date: 01/29/2016 CLINICAL DATA:  Nonproductive cough for 1 month. EXAM: CT CHEST WITHOUT CONTRAST TECHNIQUE: Multidetector CT imaging of the chest was performed following the standard protocol without IV contrast. COMPARISON:  Chest x-ray today and 12/03/2015. FINDINGS: Cardiovascular: Mild cardiomegaly. Calcified plaque over the lateral circumflex and right coronary arteries. Calcified plaque over the thoracic aorta. Mediastinum/Nodes: No definite hilar or mediastinal adenopathy. The remaining mediastinal structures are unremarkable. Lungs/Pleura: Lungs are adequately inflated and demonstrate patchy airspace consolidation over the right lower lobe and minimally right middle lobe likely pneumonia. Tiny right effusion. Mild right apical pleural thickening. Left lung is clear.  Obstruction of distal right lower lobe bronchi which may be the result of aspiration versus mucous plugging. Suggestion of a tiny amount of aspirate material over the left side of the trachea. Upper Abdomen: Calcified plaque over the abdominal aorta. Musculoskeletal: Mild degenerate change of the spine. Two adjacent mild compression deformities over the mid thoracic spine unchanged. IMPRESSION: Airspace consolidation over the right lower lobe and minimally involving the right middle lobe compatible with a pneumonia. Consider possible aspiration as etiology as there is obstruction of distal right lower lobe bronchus which may be due to mucous plugging versus aspiration. Small associated right pleural effusion. Cardiomegaly and evidence of atherosclerotic coronary artery disease. Aortic atherosclerosis. Two adjacent mild compression deformities over the mid thoracic spine unchanged. Electronically Signed   By: Elberta Fortis M.D.   On: 01/29/2016 15:54    Micro Results     Recent Results (from the past 240 hour(s))  Urine culture     Status: None   Collection Time: 01/29/16  1:30 PM  Result Value Ref Range Status   Specimen Description URINE, CATHETERIZED  Final   Special Requests  NONE  Final   Culture NO GROWTH Performed at [facility]   Final   Report Status 01/30/2016 FINAL  Final  Blood culture (routine x 2)     Status: None (Preliminary result)   Collection Time: 01/29/16  2:30 PM  Result Value Ref Range Status   Specimen Description BLOOD LEFT ASSIST CONTROL  Final   Special Requests   Final    BOTTLES DRAWN AEROBIC AND ANAEROBIC  AEROBOC 1CC, ANAEROBIC 3CC   Culture NO GROWTH 3 DAYS  Final   Report Status PENDING  Incomplete  Blood culture (routine x 2)     Status: None (Preliminary result)   Collection Time: 01/29/16  3:11 PM  Result Value Ref Range Status   Specimen Description BLOOD  Final   Special Requests BOTTLES DRAWN AEROBIC AND ANAEROBIC 10CC  Final   Culture NO  GROWTH 3 DAYS  Final   Report Status PENDING  Incomplete  MRSA PCR Screening     Status: None   Collection Time: 01/29/16  5:28 PM  Result Value Ref Range Status   MRSA by PCR NEGATIVE NEGATIVE Final    Comment:        The GeneXpert MRSA Assay (FDA approved for NASAL specimens only), is one component of a comprehensive MRSA colonization surveillance program. It is not intended to diagnose MRSA infection nor to guide or monitor treatment for MRSA infections.        Today   Subjective:   Leniya Breit today has no shortnessof breath, no wheezing. Stable for discharge.  Objective:   Blood pressure (!) 161/66, pulse 61, temperature 98.1 F (36.7 C), temperature source Oral, resp. rate 19, height 5\' 2"  (1.575 m), weight 53.1 kg (117 lb), SpO2 92 %.   Intake/Output Summary (Last 24 hours) at 02/01/16 1339 Last data filed at 02/01/16 0947  Gross per 24 hour  Intake              360 ml  Output               50 ml  Net              310 ml    Exam Awake Alert, Oriented x 3, No new F.N deficits, Normal affect [place].AT,PERRAL Supple Neck,No JVD, No cervical lymphadenopathy appriciated.  Symmetrical Chest wall movement, Good air movement bilaterally, CTAB RRR,No Gallops,Rubs or new Murmurs, No Parasternal Heave +ve B.Sounds, Abd Soft, Non tender, No organomegaly appriciated, No rebound -guarding or rigidity. No Cyanosis, Clubbing or edema, No new Rash or bruise  Data Review   CBC w Diff: Lab Results  Component Value Date   WBC 11.7 (H) 01/30/2016   HGB 11.3 (L) 01/30/2016   HGB 12.2 04/08/2013   HCT 34.0 (L) 01/30/2016   HCT 34.9 (L) 04/08/2013   PLT 176 01/30/2016   PLT 222 04/08/2013   LYMPHOPCT 42 12/03/2015   LYMPHOPCT 4.8 04/08/2013   MONOPCT 8 12/03/2015   MONOPCT 0.7 04/08/2013   EOSPCT 3 12/03/2015   EOSPCT 0.1 04/08/2013   BASOPCT 1 12/03/2015   BASOPCT 0.3 04/08/2013    CMP: Lab Results  Component Value Date   NA 140 01/30/2016   NA 138 04/08/2013    K 4.1 01/30/2016   K 4.2 04/08/2013   CL 108 01/30/2016   CL 105 04/08/2013   CO2 26 01/30/2016   CO2 27 04/08/2013   BUN 21 (H) 01/30/2016   BUN 10 04/08/2013   CREATININE 0.62 01/30/2016  CREATININE 0.98 04/08/2013   PROT 6.2 (L) 01/30/2016   PROT 7.4 04/07/2013   ALBUMIN 3.1 (L) 01/30/2016   ALBUMIN 3.9 04/07/2013   BILITOT 0.8 01/30/2016   BILITOT 0.4 04/07/2013   ALKPHOS 54 01/30/2016   ALKPHOS 91 04/07/2013   AST 21 01/30/2016   AST 39 (H) 04/07/2013   ALT 16 01/30/2016   ALT 28 04/07/2013  .   Total Time in preparing paper work, data evaluation and todays exam - 35 minutes  Elo Marmolejos M.D on 02/01/2016 at 1:39 PM    Note: This dictation was prepared with Dragon dictation along with smaller phrase technology. Any transcriptional errors that result from this process are unintentional.

## 2016-02-01 NOTE — Progress Notes (Signed)
Patient is medically stable for D/C back to [facility] today. RN case manager arranged home health. Clinical Child psychotherapistocial Worker (CSW) sent D/C Summary and FL2 to [facility]. Patient's brother transported patient. RN called report to Med Tech at [facility]. Please reconsult if future social work needs arise. CSW signing off.   Baker Hughes IncorporatedBailey Colyn Miron, LCSW [phone]

## 2016-02-01 NOTE — Care Management Note (Signed)
Case Management Note  Patient Details  Name: Donna Goodman MRN: [id number] Date of Birth: Jul 01, 1927  Subjective/Objective:   Discharging to [facility].                 Action/Plan: [facility] notified of discharge and PT follow up  Expected Discharge Date:                  Expected Discharge Plan:  Assisted Living / Rest Home  In-House Referral:     Discharge planning Services     Post Acute Care Choice:  Home Health Choice offered to:     DME Arranged:    DME Agency:     HH Arranged:  PT HH Agency:  [facility] ([facility])  Status of Service:  Completed, signed off  If discussed at [employer] Meetings, dates discussed:    Additional Comments:  Marily MemosLisa M Anneke Cundy, RN 02/01/2016, 2:23 PM

## 2016-02-03 LAB — CULTURE, BLOOD (ROUTINE X 2)
CULTURE: NO GROWTH
CULTURE: NO GROWTH

## 2016-05-25 ENCOUNTER — Emergency Department: Payer: Medicare Other

## 2016-05-25 ENCOUNTER — Emergency Department
Admission: EM | Admit: 2016-05-25 | Discharge: 2016-05-25 | Disposition: A | Discharge status: Home / Self Care | Payer: Medicare Other | Attending: Emergency Medicine | Admitting: Emergency Medicine

## 2016-05-25 DIAGNOSIS — G319 Degenerative disease of nervous system, unspecified: Secondary | ICD-10-CM | POA: Insufficient documentation

## 2016-05-25 DIAGNOSIS — J449 Chronic obstructive pulmonary disease, unspecified: Secondary | ICD-10-CM | POA: Insufficient documentation

## 2016-05-25 DIAGNOSIS — Y92129 Unspecified place in nursing home as the place of occurrence of the external cause: Secondary | ICD-10-CM | POA: Diagnosis not present

## 2016-05-25 DIAGNOSIS — Y999 Unspecified external cause status: Secondary | ICD-10-CM | POA: Insufficient documentation

## 2016-05-25 DIAGNOSIS — Z043 Encounter for examination and observation following other accident: Secondary | ICD-10-CM | POA: Diagnosis not present

## 2016-05-25 DIAGNOSIS — W19XXXA Unspecified fall, initial encounter: Secondary | ICD-10-CM | POA: Diagnosis not present

## 2016-05-25 DIAGNOSIS — F0390 Unspecified dementia without behavioral disturbance: Secondary | ICD-10-CM | POA: Diagnosis not present

## 2016-05-25 DIAGNOSIS — Y939 Activity, unspecified: Secondary | ICD-10-CM | POA: Diagnosis not present

## 2016-05-25 DIAGNOSIS — J189 Pneumonia, unspecified organism: Secondary | ICD-10-CM | POA: Diagnosis not present

## 2016-05-25 DIAGNOSIS — J45909 Unspecified asthma, uncomplicated: Secondary | ICD-10-CM | POA: Insufficient documentation

## 2016-05-25 DIAGNOSIS — Z87891 Personal history of nicotine dependence: Secondary | ICD-10-CM | POA: Diagnosis not present

## 2016-05-25 DIAGNOSIS — I10 Essential (primary) hypertension: Secondary | ICD-10-CM | POA: Diagnosis not present

## 2016-05-25 DIAGNOSIS — Z7982 Long term (current) use of aspirin: Secondary | ICD-10-CM | POA: Insufficient documentation

## 2016-05-25 DIAGNOSIS — Z79899 Other long term (current) drug therapy: Secondary | ICD-10-CM | POA: Diagnosis not present

## 2016-05-25 LAB — COMPREHENSIVE METABOLIC PANEL
ALBUMIN: 3.1 g/dL — AB (ref 3.5–5.0)
ALK PHOS: 51 U/L (ref 38–126)
ALT: 13 U/L — AB (ref 14–54)
AST: 22 U/L (ref 15–41)
Anion gap: 8 (ref 5–15)
BILIRUBIN TOTAL: 0.7 mg/dL (ref 0.3–1.2)
BUN: 15 mg/dL (ref 6–20)
CALCIUM: 8.5 mg/dL — AB (ref 8.9–10.3)
CO2: 21 mmol/L — AB (ref 22–32)
CREATININE: 0.67 mg/dL (ref 0.44–1.00)
Chloride: 108 mmol/L (ref 101–111)
GFR calc Af Amer: >60 mL/min (ref >60)
GFR calc non Af Amer: >60 mL/min (ref >60)
GLUCOSE: 118 mg/dL — AB (ref 65–99)
Potassium: 3.4 mmol/L — ABNORMAL LOW (ref 3.5–5.1)
SODIUM: 137 mmol/L (ref 135–145)
TOTAL PROTEIN: 6.2 g/dL — AB (ref 6.5–8.1)

## 2016-05-25 LAB — URINALYSIS, COMPLETE (UACMP) WITH MICROSCOPIC
Bacteria, UA: NONE SEEN
Glucose, UA: NEGATIVE mg/dL
Hgb urine dipstick: NEGATIVE
Ketones, ur: NEGATIVE mg/dL
Leukocytes, UA: NEGATIVE
Nitrite: NEGATIVE
PH: 6 (ref 5.0–8.0)
Protein, ur: 30 mg/dL — AB
RBC / HPF: NONE SEEN RBC/hpf (ref 0–5)
SPECIFIC GRAVITY, URINE: 1.027 (ref 1.005–1.030)

## 2016-05-25 LAB — CBC WITH DIFFERENTIAL/PLATELET
BASOS ABS: 0 10*3/uL (ref 0–0.1)
Basophils Relative: 0 %
EOS ABS: 0.2 10*3/uL (ref 0–0.7)
EOS PCT: 2 %
HEMATOCRIT: 33.2 % — AB (ref 35.0–47.0)
Hemoglobin: 11.1 g/dL — ABNORMAL LOW (ref 12.0–16.0)
LYMPHS ABS: 1.5 10*3/uL (ref 1.0–3.6)
Lymphocytes Relative: 14 %
MCH: 30.8 pg (ref 26.0–34.0)
MCHC: 33.3 g/dL (ref 32.0–36.0)
MCV: 92.6 fL (ref 80.0–100.0)
MONO ABS: 1.3 10*3/uL — AB (ref 0.2–0.9)
MONOS PCT: 12 %
NEUTROS PCT: 72 %
Neutro Abs: 8.1 10*3/uL — ABNORMAL HIGH (ref 1.4–6.5)
Platelets: 208 10*3/uL (ref 150–440)
RBC: 3.58 MIL/uL — ABNORMAL LOW (ref 3.80–5.20)
RDW: 13.3 % (ref 11.5–14.5)
WBC: 11.1 10*3/uL — ABNORMAL HIGH (ref 3.6–11.0)

## 2016-05-25 LAB — TROPONIN I: Troponin I: 0.03 ng/mL (ref <0.03)

## 2016-05-25 MED ORDER — AZITHROMYCIN 500 MG IV SOLR
500.0000 mg | Freq: Once | INTRAVENOUS | Status: AC
  Administered 2016-05-25: 500 mg via INTRAVENOUS
  Filled 2016-05-25: qty 500

## 2016-05-25 MED ORDER — AMOXICILLIN 500 MG PO CAPS
1000.0000 mg | ORAL_CAPSULE | Freq: Once | ORAL | Status: AC
  Administered 2016-05-25: 1000 mg via ORAL
  Filled 2016-05-25: qty 2

## 2016-05-25 MED ORDER — AZITHROMYCIN 250 MG PO TABS: ORAL_TABLET | ORAL | 0 refills | Status: DC

## 2016-05-25 MED ORDER — AMOXICILLIN 500 MG PO TABS: 500.0000 mg | ORAL_TABLET | Freq: Two times a day (BID) | ORAL | 0 refills | Status: AC

## 2016-05-25 NOTE — ED Triage Notes (Signed)
Pt came to ED via EMS from [facility]. Pt had unwitnessed fall. Pt alert and oriented to self. Has had cough for past week. o2 88% r/a. Placed on2 L [place]. Afebrile.

## 2016-05-25 NOTE — ED Notes (Signed)
Date and time results received: 05/25/16 1142  (use smartphrase ".now" to insert current time)  Test: troponin Critical Value: 0.03  Name of Provider Notified: [email or web address]  Orders Received? Or Actions Taken?:

## 2016-05-25 NOTE — Discharge Instructions (Signed)
Please take all of your antibiotics as prescribed and return to the emergency department for any new or worsening symptoms such as fevers, chills, worsening shortness of breath, or for any other concerns.  It was a pleasure to take care of you today, and thank you for coming to our emergency department.  If you have any questions or concerns before leaving please ask the nurse to grab me and I'm more than happy to go through your aftercare instructions again.  If you were prescribed any opioid pain medication today such as Norco, Vicodin, Percocet, morphine, hydrocodone, or oxycodone please make sure you do not drive when you are taking this medication as it can alter your ability to drive safely.  If you have any concerns once you are home that you are not improving or are in fact getting worse before you can make it to your follow-up appointment, please do not hesitate to call [phone] and come back for further evaluation.  Merrily Brittle MD  Results for orders placed or performed during the hospital encounter of 05/25/16  Urinalysis, Complete w Microscopic  Result Value Ref Range   Color, Urine AMBER (A) YELLOW   APPearance HAZY (A) CLEAR   Specific Gravity, Urine 1.027 1.005 - 1.030   pH 6.0 5.0 - 8.0   Glucose, UA NEGATIVE NEGATIVE mg/dL   Hgb urine dipstick NEGATIVE NEGATIVE   Bilirubin Urine MODERATE (A) NEGATIVE   Ketones, ur NEGATIVE NEGATIVE mg/dL   Protein, ur 30 (A) NEGATIVE mg/dL   Nitrite NEGATIVE NEGATIVE   Leukocytes, UA NEGATIVE NEGATIVE   RBC / HPF NONE SEEN 0 - 5 RBC/hpf   WBC, UA 0-5 0 - 5 WBC/hpf   Bacteria, UA NONE SEEN NONE SEEN   Squamous Epithelial / LPF 0-5 (A) NONE SEEN   Mucous PRESENT    Hyaline Casts, UA PRESENT   Comprehensive metabolic panel  Result Value Ref Range   Sodium 137 135 - 145 mmol/L   Potassium 3.4 (L) 3.5 - 5.1 mmol/L   Chloride 108 101 - 111 mmol/L   CO2 21 (L) 22 - 32 mmol/L   Glucose, Bld 118 (H) 65 - 99 mg/dL   BUN 15 6 - 20 mg/dL   Creatinine, Ser 1.61 0.44 - 1.00 mg/dL   Calcium 8.5 (L) 8.9 - 10.3 mg/dL   Total Protein 6.2 (L) 6.5 - 8.1 g/dL   Albumin 3.1 (L) 3.5 - 5.0 g/dL   AST 22 15 - 41 U/L   ALT 13 (L) 14 - 54 U/L   Alkaline Phosphatase 51 38 - 126 U/L   Total Bilirubin 0.7 0.3 - 1.2 mg/dL   GFR calc non Af Amer >60 >60 mL/min   GFR calc Af Amer >60 >60 mL/min   Anion gap 8 5 - 15  Troponin I  Result Value Ref Range   Troponin I 0.03 (HH) <0.03 ng/mL  CBC with Differential  Result Value Ref Range   WBC 11.1 (H) 3.6 - 11.0 K/uL   RBC 3.58 (L) 3.80 - 5.20 MIL/uL   Hemoglobin 11.1 (L) 12.0 - 16.0 g/dL   HCT 09.6 (L) 04.5 - 40.9 %   MCV 92.6 80.0 - 100.0 fL   MCH 30.8 26.0 - 34.0 pg   MCHC 33.3 32.0 - 36.0 g/dL   RDW 81.1 91.4 - 78.2 %   Platelets 208 150 - 440 K/uL   Neutrophils Relative % 72 %   Neutro Abs 8.1 (H) 1.4 - 6.5 K/uL   Lymphocytes  Relative 14 %   Lymphs Abs 1.5 1.0 - 3.6 K/uL   Monocytes Relative 12 %   Monocytes Absolute 1.3 (H) 0.2 - 0.9 K/uL   Eosinophils Relative 2 %   Eosinophils Absolute 0.2 0 - 0.7 K/uL   Basophils Relative 0 %   Basophils Absolute 0.0 0 - 0.1 K/uL  Troponin I  Result Value Ref Range   Troponin I <0.03 <0.03 ng/mL   Dg Chest 1 View  Result Date: 05/25/2016 CLINICAL DATA:  Cough for 1 week.  The patient fell today. EXAM: CHEST 1 VIEW COMPARISON:  CT chest 01/29/2016. PA and lateral chest 12/03/2015 and 03/14/2013. Single-view of the chest 04/06/2014. FINDINGS: There is streaky left basilar airspace disease. Minimal atelectasis is present in the right lung base. Heart size is upper normal. No pneumothorax or pleural effusion. Aortic atherosclerosis is noted. IMPRESSION: Streaky left basilar airspace disease could be due to atelectasis or infection. Atherosclerosis. Electronically Signed   By: Drusilla Kanner M.D.   On: 05/25/2016 10:40   Ct Head Wo Contrast  Result Date: 05/25/2016 CLINICAL DATA:  Unwitnessed fall. EXAM: CT HEAD WITHOUT CONTRAST TECHNIQUE:  Contiguous axial images were obtained from the base of the skull through the vertex without intravenous contrast. COMPARISON:  CT scan of January 29, 2016. FINDINGS: Brain: Mild diffuse cortical atrophy is noted. Mild chronic ischemic white matter disease is noted. No mass effect or midline shift is noted. Ventricular size is within normal limits. There is no evidence of mass lesion, hemorrhage or acute infarction. Vascular: Atherosclerosis of carotid siphons is noted. Skull: Normal. Negative for fracture or focal lesion. Sinuses/Orbits: Minimal fluid is noted in right maxillary sinus. Other: None. IMPRESSION: Mild diffuse cortical atrophy. Mild chronic ischemic white matter disease. No acute intracranial abnormality seen. Electronically Signed   By: Lupita Raider, M.D.   On: 05/25/2016 10:21

## 2016-05-25 NOTE — ED Provider Notes (Signed)
[facility] Emergency Department Provider Note  ____________________________________________   First MD Initiated Contact with Patient 05/25/16 0930     (approximate)  I have reviewed the triage vital signs and the nursing notes.   HISTORY  Chief Complaint Fall    HPI Donna Goodman is a 83 y.o. female who comes to the ED after a fall at her nursing home.  History is challenging to obtain as the patient has dementia.  Apparently she's had a cough for the past several days and today while walking she fell to the ground.  No trauma was noted, but she was sent to the ED to be checked.   Past Medical History:  Diagnosis Date  . Asthma   . COPD (chronic obstructive pulmonary disease) (HCC)   . Dementia   . Hyperlipemia   . Hypertension     Patient Active Problem List   Diagnosis Date Noted  . Multifocal pneumonia 01/29/2016  . SOB (shortness of breath) 01/29/2016  . Cough, persistent 01/29/2016  . COPD with acute exacerbation (HCC) 01/29/2016    Past Surgical History:  Procedure Laterality Date  . BACK SURGERY      Prior to Admission medications   Medication Sig Start Date End Date Taking? Authorizing Provider  acetaminophen (TYLENOL) 500 MG chewable tablet Chew 500 mg by mouth every 8 (eight) hours as needed for pain or fever.   Yes Historical Provider, MD  acetaminophen (TYLENOL) 500 MG tablet Take 500 mg by mouth 3 (three) times daily.    Yes Historical Provider, MD  albuterol (PROVENTIL HFA;VENTOLIN HFA) 108 (90 Base) MCG/ACT inhaler Inhale 2 puffs into the lungs every 6 (six) hours as needed for wheezing or shortness of breath. 02/01/16  Yes Katha Hamming, MD  alum & mag hydroxide-simeth (MAALOX/MYLANTA) 200-200-20 MG/5ML suspension Take 30 mLs by mouth 4 (four) times daily as needed for indigestion or heartburn.    Yes Historical Provider, MD  aspirin EC 81 MG tablet Take 81 mg by mouth daily.   Yes Historical Provider, MD    atorvastatin (LIPITOR) 20 MG tablet Take 20 mg by mouth at bedtime.   Yes Historical Provider, MD  calcium citrate (CALCITRATE - DOSED IN MG ELEMENTAL CALCIUM) 950 MG tablet Take 100 mg of elemental calcium by mouth daily.   Yes Historical Provider, MD  cetirizine (ZYRTEC) 10 MG tablet Take 10 mg by mouth daily.   Yes Historical Provider, MD  Cholecalciferol (VITAMIN D) 2000 UNITS CAPS Take 2,000 Units by mouth daily.   Yes Historical Provider, MD  diclofenac sodium (VOLTAREN) 1 % GEL Apply 2 g topically daily as needed. To right hip for pain   Yes Historical Provider, MD  divalproex (DEPAKOTE) 125 MG DR tablet Take 125 mg by mouth 3 (three) times daily.   Yes Historical Provider, MD  fluticasone (FLONASE) 50 MCG/ACT nasal spray Place 2 sprays into both nostrils daily.   Yes Historical Provider, MD  Fluticasone-Salmeterol (ADVAIR) 100-50 MCG/DOSE AEPB Inhale 1 puff into the lungs 2 (two) times daily.   Yes Historical Provider, MD  guaifenesin (ROBITUSSIN) 100 MG/5ML syrup Take 200 mg by mouth every 6 (six) hours as needed for cough.   Yes Historical Provider, MD  guaiFENesin-dextromethorphan (ROBITUSSIN DM) 100-10 MG/5ML syrup Take 5 mLs by mouth every 4 (four) hours as needed for cough. 02/01/16  Yes Katha Hamming, MD  levothyroxine (SYNTHROID, LEVOTHROID) 75 MCG tablet Take 75 mcg by mouth daily.   Yes Historical Provider, MD  LORazepam (ATIVAN)  0.5 MG tablet Take 0.5 tablets (0.25 mg total) by mouth daily as needed for anxiety. 02/01/16  Yes Katha Hamming, MD  magnesium hydroxide (MILK OF MAGNESIA) 400 MG/5ML suspension Take 30 mLs by mouth at bedtime as needed for mild constipation.   Yes Historical Provider, MD  metoprolol succinate (TOPROL-XL) 25 MG 24 hr tablet Take 25 mg by mouth daily.   Yes Historical Provider, MD  Multiple Vitamin (MULTIVITAMIN WITH MINERALS) TABS tablet Take 1 tablet by mouth daily.   Yes Historical Provider, MD  sertraline (ZOLOFT) 50 MG tablet Take 50 mg by  mouth daily.   Yes Historical Provider, MD  topiramate (TOPAMAX) 25 MG tablet Take 25 mg by mouth at bedtime.   Yes Historical Provider, MD  amoxicillin (AMOXIL) 500 MG tablet Take 1 tablet (500 mg total) by mouth 2 (two) times daily. 05/25/16 06/01/16  Merrily Brittle, MD  azithromycin (ZITHROMAX Z-PAK) 250 MG tablet Please take two tabs by mouth on day one then one tab per mouth daily for days 2-6 05/25/16   Merrily Brittle, MD  ipratropium-albuterol (DUONEB) 0.5-2.5 (3) MG/3ML SOLN Take 3 mLs by nebulization 3 (three) times daily. Patient not taking: Reported on 05/25/2016 02/01/16   Katha Hamming, MD  predniSONE (STERAPRED UNI-PAK 21 TAB) 10 MG (21) TBPK tablet Take 1 tablet (10 mg total) by mouth daily. Taper by 10 mg daily Patient not taking: Reported on 05/25/2016 02/01/16   Katha Hamming, MD    Allergies Patient has no known allergies.  No family history on file.  Social History Social History  Substance Use Topics  . Smoking status: Former Games developer  . Smokeless tobacco: Never Used  . Alcohol use No    Review of Systems Level 5 exemption history limited by the patient's dementia 10-point ROS otherwise negative.  ____________________________________________   PHYSICAL EXAM:  VITAL SIGNS: ED Triage Vitals  Enc Vitals Group     BP      Pulse      Resp      Temp      Temp src      SpO2      Weight      Height      Head Circumference      Peak Flow      Pain Score      Pain Loc      Pain Edu?      Excl. in GC?     Constitutional: Pleasant, cooperative, clearly demented Eyes: PERRL EOMI. Head: Atraumatic. Nose: No congestion/rhinnorhea. Mouth/Throat: No trismus Neck: No stridor.   Cardiovascular: Normal rate, regular rhythm. Grossly normal heart sounds.  Good peripheral circulation. Respiratory: Normal respiratory effort.  Mild crackles throughout Gastrointestinal: Soft nondistended nontender no rebound no guarding no peritonitis no McBurney's tenderness  negative Rovsing's no costovertebral tenderness negative Murphy's Musculoskeletal: No lower extremity edema   Neurologic:  Normal speech and language. No gross focal neurologic deficits are appreciated. Skin:  Skin is warm, dry and intact. No rash noted. Psychiatric: Slow, methodical speech    ____________________________________________   DIFFERENTIAL  Vasovagal syncope, cardiogenic syncope, mechanical fall, ICH, sepsis ____________________________________________   LABS (all labs ordered are listed, but only abnormal results are displayed)  Labs Reviewed  URINALYSIS, COMPLETE (UACMP) WITH MICROSCOPIC - Abnormal; Notable for the following:       Result Value   Color, Urine AMBER (*)    APPearance HAZY (*)    Bilirubin Urine MODERATE (*)    Protein, ur 30 (*)  Squamous Epithelial / LPF 0-5 (*)    All other components within normal limits  COMPREHENSIVE METABOLIC PANEL - Abnormal; Notable for the following:    Potassium 3.4 (*)    CO2 21 (*)    Glucose, Bld 118 (*)    Calcium 8.5 (*)    Total Protein 6.2 (*)    Albumin 3.1 (*)    ALT 13 (*)    All other components within normal limits  TROPONIN I - Abnormal; Notable for the following:    Troponin I 0.03 (*)    All other components within normal limits  CBC WITH DIFFERENTIAL/PLATELET - Abnormal; Notable for the following:    WBC 11.1 (*)    RBC 3.58 (*)    Hemoglobin 11.1 (*)    HCT 33.2 (*)    Neutro Abs 8.1 (*)    Monocytes Absolute 1.3 (*)    All other components within normal limits  TROPONIN I    Slightly elevated first trop, but normal second likely representing subtle demand __________________________________________  EKG  ED ECG REPORT I, Merrily Brittle, the attending physician, personally viewed and interpreted this ECG.  Date: 05/25/2016  Rate: 71 Rhythm: normal sinus rhythm QRS Axis: normal Intervals: normal ST/T Wave abnormalities: normal Conduction Disturbances: none Narrative  Interpretation: unremarkable  ____________________________________________  RADIOLOGY  CXR c/w PNA ____________________________________________   PROCEDURES  Procedure(s) performed: no  Procedures  Critical Care performed: no  ____________________________________________   INITIAL IMPRESSION / ASSESSMENT AND PLAN / ED COURSE  Pertinent labs & imaging results that were available during my care of the patient were reviewed by me and considered in my medical decision making (see chart for details).  History is challenging to obtain secondary to the patient's dementia.  Despite no clear trauma to the patient, I obtained a head CT given the history of fall which is fortunately negative.  She does have a recent history of cough and a CXR concerning for community acquired pneumonia so I'll give her a first dose of azithromycin now for atypicals and amoxicillin for strep pneumo.  She is stable for outpatient management.      ____________________________________________   FINAL CLINICAL IMPRESSION(S) / ED DIAGNOSES  Final diagnoses:  Community acquired pneumonia of left lung, unspecified part of lung  Fall, initial encounter      NEW MEDICATIONS STARTED DURING THIS VISIT:  Discharge Medication List as of 05/25/2016  1:33 PM    START taking these medications   Details  amoxicillin (AMOXIL) 500 MG tablet Take 1 tablet (500 mg total) by mouth 2 (two) times daily., Starting Fri 05/25/2016, Until Fri 06/01/2016, Print    azithromycin (ZITHROMAX Z-PAK) 250 MG tablet Please take two tabs by mouth on day one then one tab per mouth daily for days 2-6, Print         Note:  This document was prepared using Dragon voice recognition software and may include unintentional dictation errors.     Merrily Brittle, MD 05/26/16 [phone]

## 2016-10-16 ENCOUNTER — Encounter: Payer: Self-pay | Admitting: Emergency Medicine

## 2016-10-16 DIAGNOSIS — J45909 Unspecified asthma, uncomplicated: Secondary | ICD-10-CM | POA: Diagnosis not present

## 2016-10-16 DIAGNOSIS — I10 Essential (primary) hypertension: Secondary | ICD-10-CM | POA: Diagnosis not present

## 2016-10-16 DIAGNOSIS — R4182 Altered mental status, unspecified: Secondary | ICD-10-CM | POA: Diagnosis present

## 2016-10-16 DIAGNOSIS — Z87891 Personal history of nicotine dependence: Secondary | ICD-10-CM | POA: Insufficient documentation

## 2016-10-16 DIAGNOSIS — R531 Weakness: Secondary | ICD-10-CM | POA: Insufficient documentation

## 2016-10-16 DIAGNOSIS — J449 Chronic obstructive pulmonary disease, unspecified: Secondary | ICD-10-CM | POA: Insufficient documentation

## 2016-10-16 DIAGNOSIS — Z79899 Other long term (current) drug therapy: Secondary | ICD-10-CM | POA: Diagnosis not present

## 2016-10-16 DIAGNOSIS — Z7982 Long term (current) use of aspirin: Secondary | ICD-10-CM | POA: Diagnosis not present

## 2016-10-16 LAB — CBC
HCT: 35.3 % (ref 35.0–47.0)
Hemoglobin: 11.8 g/dL — ABNORMAL LOW (ref 12.0–16.0)
MCH: 32.1 pg (ref 26.0–34.0)
MCHC: 33.3 g/dL (ref 32.0–36.0)
MCV: 96.4 fL (ref 80.0–100.0)
Platelets: 213 K/uL (ref 150–440)
RBC: 3.66 MIL/uL — ABNORMAL LOW (ref 3.80–5.20)
RDW: 13.4 % (ref 11.5–14.5)
WBC: 6.9 K/uL (ref 3.6–11.0)

## 2016-10-16 LAB — BASIC METABOLIC PANEL WITH GFR
Anion gap: 9 (ref 5–15)
BUN: 24 mg/dL — ABNORMAL HIGH (ref 6–20)
CO2: 26 mmol/L (ref 22–32)
Calcium: 9.3 mg/dL (ref 8.9–10.3)
Chloride: 104 mmol/L (ref 101–111)
Creatinine, Ser: 1.09 mg/dL — ABNORMAL HIGH (ref 0.44–1.00)
GFR calc Af Amer: 51 mL/min — ABNORMAL LOW
GFR calc non Af Amer: 44 mL/min — ABNORMAL LOW
Glucose, Bld: 130 mg/dL — ABNORMAL HIGH (ref 65–99)
Potassium: 4.2 mmol/L (ref 3.5–5.1)
Sodium: 139 mmol/L (ref 135–145)

## 2016-10-16 LAB — TROPONIN I

## 2016-10-16 NOTE — ED Triage Notes (Signed)
Pt to ed with c/o weakness from [facility].  Per [facility] and brandi at [employer] pt was behaving differently than normal. Pt was not talking and would not eat per staff, they also reported that she was unable to ambulate, although pt brother states he was with pt today and she was walking with her walker as usual.  Pt able to talk to me at triage, confused to place and time, which brother states is normal.  Pt is alert and oriented to self and to brother.  Pt cooperative and skin warm and dry.  Pt denies pain.

## 2016-10-17 ENCOUNTER — Emergency Department
Admission: EM | Admit: 2016-10-17 | Discharge: 2016-10-17 | Disposition: A | Discharge status: Home / Self Care | Payer: Medicare Other | Attending: Emergency Medicine | Admitting: Emergency Medicine

## 2016-10-17 ENCOUNTER — Emergency Department: Payer: Medicare Other

## 2016-10-17 DIAGNOSIS — R531 Weakness: Secondary | ICD-10-CM | POA: Diagnosis not present

## 2016-10-17 LAB — URINALYSIS, COMPLETE (UACMP) WITH MICROSCOPIC
BILIRUBIN URINE: NEGATIVE
Glucose, UA: NEGATIVE mg/dL
Hgb urine dipstick: NEGATIVE
Ketones, ur: NEGATIVE mg/dL
Leukocytes, UA: NEGATIVE
Nitrite: NEGATIVE
PH: 5 (ref 5.0–8.0)
Protein, ur: NEGATIVE mg/dL
SPECIFIC GRAVITY, URINE: 1.014 (ref 1.005–1.030)
SQUAMOUS EPITHELIAL / LPF: NONE SEEN
WBC, UA: NONE SEEN WBC/hpf (ref 0–5)

## 2016-10-17 LAB — TROPONIN I

## 2016-10-17 NOTE — Discharge Instructions (Signed)
Please follow up with your primary care physician.

## 2016-10-17 NOTE — ED Provider Notes (Signed)
[facility] Emergency Department Provider Note   ____________________________________________   First MD Initiated Contact with Patient 10/17/16 0100     (approximate)  I have reviewed the triage vital signs and the nursing notes.   HISTORY  Chief Complaint Weakness   HPI Donna Goodman is a 83 y.o. female Who comes into the hospital today from her nursing home. The patient was sent because they state that she was disoriented and confused. The patient's family states that she does have a history of dementia. The patient's brother was with her today and he reports that she wasn't talking like she typically does. When the patient arrived here she was asleep but woke up and stated that something wasn't right. They were concerned that it may be her head or an infection so they decided to have her come in and get checked out. The family states that today she played with a tape dispenser which was not normal for her and she didn't offer to walk with the family like she typically does. The patient denies any chest pain, abdominal pain, nausea, vomiting. She also denies any weakness. The patient is here today for evaluation of her status change.the patient is not in any pain at this time.   Past Medical History:  Diagnosis Date  . Asthma   . COPD (chronic obstructive pulmonary disease) (HCC)   . Dementia   . Hyperlipemia   . Hypertension     Patient Active Problem List   Diagnosis Date Noted  . Multifocal pneumonia 01/29/2016  . SOB (shortness of breath) 01/29/2016  . Cough, persistent 01/29/2016  . COPD with acute exacerbation (HCC) 01/29/2016    Past Surgical History:  Procedure Laterality Date  . BACK SURGERY      Prior to Admission medications   Medication Sig Start Date End Date Taking? Authorizing Provider  acetaminophen (TYLENOL) 500 MG chewable tablet Chew 500 mg by mouth every 8 (eight) hours as needed for pain or fever.    [provider]  acetaminophen (TYLENOL) 500 MG tablet Take 500 mg by mouth 3 (three) times daily.     [provider]  albuterol (PROVENTIL HFA;VENTOLIN HFA) 108 (90 Base) MCG/ACT inhaler Inhale 2 puffs into the lungs every 6 (six) hours as needed for wheezing or shortness of breath. 02/01/16   Katha Hamming, MD  alum & mag hydroxide-simeth (MAALOX/MYLANTA) 200-200-20 MG/5ML suspension Take 30 mLs by mouth 4 (four) times daily as needed for indigestion or heartburn.     [provider]  aspirin EC 81 MG tablet Take 81 mg by mouth daily.    [provider]  atorvastatin (LIPITOR) 20 MG tablet Take 20 mg by mouth at bedtime.    [provider]  azithromycin (ZITHROMAX Z-PAK) 250 MG tablet Please take two tabs by mouth on day one then one tab per mouth daily for days 2-6 05/25/16   Merrily Brittle, MD  calcium citrate (CALCITRATE - DOSED IN MG ELEMENTAL CALCIUM) 950 MG tablet Take 100 mg of elemental calcium by mouth daily.    [provider]  cetirizine (ZYRTEC) 10 MG tablet Take 10 mg by mouth daily.    [provider]  Cholecalciferol (VITAMIN D) 2000 UNITS CAPS Take 2,000 Units by mouth daily.    [provider]  diclofenac sodium (VOLTAREN) 1 % GEL Apply 2 g topically daily as needed. To right hip for pain    [provider]  divalproex (DEPAKOTE) 125 MG DR  tablet Take 125 mg by mouth 3 (three) times daily.    [provider]  fluticasone (FLONASE) 50 MCG/ACT nasal spray Place 2 sprays into both nostrils daily.    [provider]  Fluticasone-Salmeterol (ADVAIR) 100-50 MCG/DOSE AEPB Inhale 1 puff into the lungs 2 (two) times daily.    [provider]  guaifenesin (ROBITUSSIN) 100 MG/5ML syrup Take 200 mg by mouth every 6 (six) hours as needed for cough.    [provider]  guaiFENesin-dextromethorphan (ROBITUSSIN DM) 100-10 MG/5ML syrup Take 5 mLs by mouth every 4 (four) hours as needed  for cough. 02/01/16   Katha Hamming, MD  ipratropium-albuterol (DUONEB) 0.5-2.5 (3) MG/3ML SOLN Take 3 mLs by nebulization 3 (three) times daily. Patient not taking: Reported on 05/25/2016 02/01/16   Katha Hamming, MD  levothyroxine (SYNTHROID, LEVOTHROID) 75 MCG tablet Take 75 mcg by mouth daily.    [provider]  LORazepam (ATIVAN) 0.5 MG tablet Take 0.5 tablets (0.25 mg total) by mouth daily as needed for anxiety. 02/01/16   Katha Hamming, MD  magnesium hydroxide (MILK OF MAGNESIA) 400 MG/5ML suspension Take 30 mLs by mouth at bedtime as needed for mild constipation.    [provider]  metoprolol succinate (TOPROL-XL) 25 MG 24 hr tablet Take 25 mg by mouth daily.    [provider]  Multiple Vitamin (MULTIVITAMIN WITH MINERALS) TABS tablet Take 1 tablet by mouth daily.    [provider]  predniSONE (STERAPRED UNI-PAK 21 TAB) 10 MG (21) TBPK tablet Take 1 tablet (10 mg total) by mouth daily. Taper by 10 mg daily Patient not taking: Reported on 05/25/2016 02/01/16   Katha Hamming, MD  sertraline (ZOLOFT) 50 MG tablet Take 50 mg by mouth daily.    [provider]  topiramate (TOPAMAX) 25 MG tablet Take 25 mg by mouth at bedtime.    [provider]    Allergies Patient has no known allergies.  History reviewed. No pertinent family history.  Social History Social History  Substance Use Topics  . Smoking status: Former Games developer  . Smokeless tobacco: Never Used  . Alcohol use No    Review of Systems  Constitutional: No fever/chills Eyes: No visual changes. ENT: No sore throat. Cardiovascular: Denies chest pain. Respiratory: Denies shortness of breath. Gastrointestinal: No abdominal pain.  No nausea, no vomiting.  No diarrhea.  No constipation. Genitourinary: Negative for dysuria. Musculoskeletal: Negative for back pain. Skin: Negative for rash. Neurological:  weakness   ____________________________________________   PHYSICAL EXAM:  VITAL SIGNS: ED Triage Vitals  Enc Vitals Group     BP 10/16/16 1851 (!) 143/78     Pulse Rate 10/16/16 1851 60     Resp 10/16/16 1851 18     Temp 10/16/16 1851 98.7 F (37.1 C)     Temp Source 10/16/16 1851 Oral     SpO2 10/16/16 1851 100 %     Weight 10/16/16 1851 122 lb (55.3 kg)     Height --      Head Circumference --      Peak Flow --      Pain Score 10/16/16 1843 0     Pain Loc --      Pain Edu? --      Excl. in GC? --     Constitutional: Alert with some confusion, patient does not know her full date of birth nor her location.. Well appearing and in no acute distress. Eyes: Conjunctivae are normal. PERRL. EOMI. Head: Atraumatic. Nose:  No congestion/rhinnorhea. Mouth/Throat: Mucous membranes are moist.  Oropharynx non-erythematous. Cardiovascular: Normal rate, regular rhythm. Grossly normal heart sounds.  Good peripheral circulation. Respiratory: Normal respiratory effort.  No retractions. Lungs CTAB. Gastrointestinal: Soft and nontender. No distention. Positive bowel sounds Musculoskeletal: No lower extremity tenderness nor edema.  Neurologic:  Normal speech and language. Radial nerves II through XII are grossly intact with no focal motor or neuro deficits strength 4-5 out of 5 in upper and lower extremities Skin:  Skin is warm, dry and intact.  Psychiatric: Mood and affect are normal.   ____________________________________________   LABS (all labs ordered are listed, but only abnormal results are displayed)  Labs Reviewed  BASIC METABOLIC PANEL - Abnormal; Notable for the following:       Result Value   Glucose, Bld 130 (*)    BUN 24 (*)    Creatinine, Ser 1.09 (*)    GFR calc non Af Amer 44 (*)    GFR calc Af Amer 51 (*)    All other components within normal limits  CBC - Abnormal; Notable for the following:    RBC 3.66 (*)    Hemoglobin 11.8 (*)    All other components within  normal limits  URINALYSIS, COMPLETE (UACMP) WITH MICROSCOPIC - Abnormal; Notable for the following:    Color, Urine YELLOW (*)    APPearance CLEAR (*)    Bacteria, UA RARE (*)    All other components within normal limits  TROPONIN I  TROPONIN I   ____________________________________________  EKG  ED ECG REPORT I, Rebecka ApleyWebster,  Allison P, the attending physician, personally viewed and interpreted this ECG.   Date: 10/16/2016  EKG Time: 1843  Rate: 54  Rhythm: sinus bradycardia, occasional PVC noted, unifocal  Axis: normal  Intervals:none  ST&T Change: none  ____________________________________________  RADIOLOGY  Ct Head Wo Contrast  Result Date: 10/17/2016 CLINICAL DATA:  Altered mental status. EXAM: CT HEAD WITHOUT CONTRAST TECHNIQUE: Contiguous axial images were obtained from the base of the skull through the vertex without intravenous contrast. COMPARISON:  05/25/2016 FINDINGS: Brain: There is no intracranial hemorrhage, mass or evidence of acute infarction. There is moderate generalized atrophy. There is moderate chronic microvascular ischemic change. There is no significant extra-axial fluid collection. No acute intracranial findings are evident. Unchanged basal ganglia calcifications. Unchanged cerebellar calcifications. Vascular: No hyperdense vessel or unexpected calcification. Skull: Normal. Negative for fracture or focal lesion. Sinuses/Orbits: No acute finding. Other: None. IMPRESSION: No acute intracranial findings. There is moderate generalized atrophy and extensive white matter hypodensities which likely represent small vessel ischemic disease. Electronically Signed   By: Ellery Plunkaniel R Mitchell M.D.   On: 10/17/2016 02:20    ____________________________________________   PROCEDURES  Procedure(s) performed: None  Procedures  Critical Care performed: No  ____________________________________________   INITIAL IMPRESSION / ASSESSMENT AND PLAN / ED COURSE  Pertinent  labs & imaging results that were available during my care of the patient were reviewed by me and considered in my medical decision making (see chart for details).  This is an 83 year old female who comes into the hospital today with some decreased activity per her family and some disorientation according to [facility]. They state that the patient did not want to eat dinner and the family states that she did need as well. The patient's glucose though is 1:30. Her blood work is unremarkable. I did send the patient for a CT scan of her head which was also unremarkable. The patient's urinalysis is negative. She is speaking well  and her strength is unremarkable in the emergency department. I am unsure what exactly caused her symptoms although she doesn't have any acute complaints. I will have the patient follow-up with her primary care physician or with the acute care clinic. The patient will be discharged to home with her family and will be further evaluated.      ____________________________________________   FINAL CLINICAL IMPRESSION(S) / ED DIAGNOSES  Final diagnoses:  Weakness      NEW MEDICATIONS STARTED DURING THIS VISIT:  Discharge Medication List as of 10/17/2016  4:13 AM       Note:  This document was prepared using Dragon voice recognition software and may include unintentional dictation errors.    Rebecka Apley, MD 10/17/16 [phone]

## 2017-02-06 ENCOUNTER — Encounter: Payer: Self-pay | Admitting: Emergency Medicine

## 2017-02-06 ENCOUNTER — Emergency Department: Payer: Medicare Other

## 2017-02-06 ENCOUNTER — Other Ambulatory Visit: Payer: Self-pay

## 2017-02-06 ENCOUNTER — Inpatient Hospital Stay
Admission: EM | Admit: 2017-02-06 | Discharge: 2017-02-09 | DRG: 480 | Disposition: A | Discharge status: Skilled Nursing Facility | Payer: Medicare Other | Attending: Internal Medicine | Admitting: Internal Medicine

## 2017-02-06 DIAGNOSIS — E876 Hypokalemia: Secondary | ICD-10-CM | POA: Diagnosis present

## 2017-02-06 DIAGNOSIS — F329 Major depressive disorder, single episode, unspecified: Secondary | ICD-10-CM | POA: Diagnosis present

## 2017-02-06 DIAGNOSIS — J449 Chronic obstructive pulmonary disease, unspecified: Secondary | ICD-10-CM | POA: Diagnosis present

## 2017-02-06 DIAGNOSIS — J9601 Acute respiratory failure with hypoxia: Secondary | ICD-10-CM | POA: Diagnosis not present

## 2017-02-06 DIAGNOSIS — I10 Essential (primary) hypertension: Secondary | ICD-10-CM | POA: Diagnosis present

## 2017-02-06 DIAGNOSIS — E039 Hypothyroidism, unspecified: Secondary | ICD-10-CM | POA: Diagnosis present

## 2017-02-06 DIAGNOSIS — Z79899 Other long term (current) drug therapy: Secondary | ICD-10-CM | POA: Diagnosis not present

## 2017-02-06 DIAGNOSIS — W182XXA Fall in (into) shower or empty bathtub, initial encounter: Secondary | ICD-10-CM | POA: Diagnosis present

## 2017-02-06 DIAGNOSIS — Z66 Do not resuscitate: Secondary | ICD-10-CM | POA: Diagnosis present

## 2017-02-06 DIAGNOSIS — E785 Hyperlipidemia, unspecified: Secondary | ICD-10-CM | POA: Diagnosis present

## 2017-02-06 DIAGNOSIS — W19XXXA Unspecified fall, initial encounter: Secondary | ICD-10-CM

## 2017-02-06 DIAGNOSIS — Y92121 Bathroom in nursing home as the place of occurrence of the external cause: Secondary | ICD-10-CM | POA: Diagnosis not present

## 2017-02-06 DIAGNOSIS — R0902 Hypoxemia: Secondary | ICD-10-CM

## 2017-02-06 DIAGNOSIS — Z87891 Personal history of nicotine dependence: Secondary | ICD-10-CM | POA: Diagnosis not present

## 2017-02-06 DIAGNOSIS — S72001A Fracture of unspecified part of neck of right femur, initial encounter for closed fracture: Secondary | ICD-10-CM | POA: Diagnosis present

## 2017-02-06 DIAGNOSIS — M810 Age-related osteoporosis without current pathological fracture: Secondary | ICD-10-CM | POA: Diagnosis present

## 2017-02-06 DIAGNOSIS — S72009A Fracture of unspecified part of neck of unspecified femur, initial encounter for closed fracture: Secondary | ICD-10-CM | POA: Diagnosis present

## 2017-02-06 DIAGNOSIS — Z419 Encounter for procedure for purposes other than remedying health state, unspecified: Secondary | ICD-10-CM

## 2017-02-06 DIAGNOSIS — Z7951 Long term (current) use of inhaled steroids: Secondary | ICD-10-CM

## 2017-02-06 DIAGNOSIS — Z79890 Hormone replacement therapy (postmenopausal): Secondary | ICD-10-CM

## 2017-02-06 DIAGNOSIS — Z791 Long term (current) use of non-steroidal anti-inflammatories (NSAID): Secondary | ICD-10-CM

## 2017-02-06 DIAGNOSIS — F0390 Unspecified dementia without behavioral disturbance: Secondary | ICD-10-CM | POA: Diagnosis present

## 2017-02-06 DIAGNOSIS — R410 Disorientation, unspecified: Secondary | ICD-10-CM | POA: Diagnosis not present

## 2017-02-06 LAB — BASIC METABOLIC PANEL
ANION GAP: 7 (ref 5–15)
BUN: 23 mg/dL — ABNORMAL HIGH (ref 6–20)
CALCIUM: 9.3 mg/dL (ref 8.9–10.3)
CHLORIDE: 108 mmol/L (ref 101–111)
CO2: 27 mmol/L (ref 22–32)
CREATININE: 0.76 mg/dL (ref 0.44–1.00)
GFR calc non Af Amer: >60 mL/min (ref >60)
Glucose, Bld: 107 mg/dL — ABNORMAL HIGH (ref 65–99)
Potassium: 3.6 mmol/L (ref 3.5–5.1)
SODIUM: 142 mmol/L (ref 135–145)

## 2017-02-06 LAB — CBC WITH DIFFERENTIAL/PLATELET
BASOS ABS: 0 10*3/uL (ref 0–0.1)
BASOS PCT: 0 %
EOS ABS: 0.1 10*3/uL (ref 0–0.7)
Eosinophils Relative: 1 %
HEMATOCRIT: 38.4 % (ref 35.0–47.0)
HEMOGLOBIN: 12.6 g/dL (ref 12.0–16.0)
Lymphocytes Relative: 14 %
Lymphs Abs: 1.5 10*3/uL (ref 1.0–3.6)
MCH: 31.6 pg (ref 26.0–34.0)
MCHC: 32.9 g/dL (ref 32.0–36.0)
MCV: 96.1 fL (ref 80.0–100.0)
Monocytes Absolute: 0.6 10*3/uL (ref 0.2–0.9)
Monocytes Relative: 5 %
NEUTROS ABS: 9 10*3/uL — AB (ref 1.4–6.5)
NEUTROS PCT: 80 %
Platelets: 155 10*3/uL (ref 150–440)
RBC: 4 MIL/uL (ref 3.80–5.20)
RDW: 12.7 % (ref 11.5–14.5)
WBC: 11.3 10*3/uL — AB (ref 3.6–11.0)

## 2017-02-06 LAB — URINALYSIS, COMPLETE (UACMP) WITH MICROSCOPIC
BACTERIA UA: NONE SEEN
BILIRUBIN URINE: NEGATIVE
Glucose, UA: NEGATIVE mg/dL
Hgb urine dipstick: NEGATIVE
KETONES UR: NEGATIVE mg/dL
LEUKOCYTES UA: NEGATIVE
NITRITE: NEGATIVE
PROTEIN: NEGATIVE mg/dL
RBC / HPF: NONE SEEN RBC/hpf (ref 0–5)
SQUAMOUS EPITHELIAL / LPF: NONE SEEN
Specific Gravity, Urine: 1.006 (ref 1.005–1.030)
pH: 7 (ref 5.0–8.0)

## 2017-02-06 LAB — TSH: TSH: 0.537 u[IU]/mL (ref 0.350–4.500)

## 2017-02-06 LAB — MRSA PCR SCREENING: MRSA BY PCR: NEGATIVE

## 2017-02-06 MED ORDER — GUAIFENESIN 100 MG/5ML PO SYRP
200.0000 mg | ORAL_SOLUTION | Freq: Four times a day (QID) | ORAL | Status: DC | PRN
Start: 2017-02-06 — End: 2017-02-09
  Filled 2017-02-06: qty 10

## 2017-02-06 MED ORDER — SERTRALINE HCL 50 MG PO TABS
50.0000 mg | ORAL_TABLET | Freq: Every day | ORAL | Status: DC
  Administered 2017-02-06 – 2017-02-09 (×3): 50 mg via ORAL
  Filled 2017-02-06 (×3): qty 1

## 2017-02-06 MED ORDER — ALUM & MAG HYDROXIDE-SIMETH 200-200-20 MG/5ML PO SUSP: 30.0000 mL | Freq: Four times a day (QID) | ORAL | Status: DC | PRN

## 2017-02-06 MED ORDER — MOMETASONE FURO-FORMOTEROL FUM 100-5 MCG/ACT IN AERO
2.0000 | INHALATION_SPRAY | Freq: Two times a day (BID) | RESPIRATORY_TRACT | Status: DC
  Administered 2017-02-06 – 2017-02-08 (×3): 2 via RESPIRATORY_TRACT
  Filled 2017-02-06: qty 8.8

## 2017-02-06 MED ORDER — ATORVASTATIN CALCIUM 20 MG PO TABS
20.0000 mg | ORAL_TABLET | Freq: Every day | ORAL | Status: DC
  Administered 2017-02-06 – 2017-02-08 (×3): 20 mg via ORAL
  Filled 2017-02-06 (×3): qty 1

## 2017-02-06 MED ORDER — SODIUM CHLORIDE 0.9 % IV SOLN
INTRAVENOUS | Status: DC
  Administered 2017-02-06 – 2017-02-08 (×4): via INTRAVENOUS

## 2017-02-06 MED ORDER — CHLORHEXIDINE GLUCONATE CLOTH 2 % EX PADS
6.0000 | MEDICATED_PAD | Freq: Every day | CUTANEOUS | Status: DC
  Administered 2017-02-06 – 2017-02-09 (×4): 6 via TOPICAL

## 2017-02-06 MED ORDER — ACETAMINOPHEN 650 MG RE SUPP: 650.0000 mg | Freq: Four times a day (QID) | RECTAL | Status: DC | PRN

## 2017-02-06 MED ORDER — CEFAZOLIN (ANCEF) 1 G IV SOLR: 1.0000 g | INTRAVENOUS | Status: DC

## 2017-02-06 MED ORDER — METOPROLOL SUCCINATE ER 25 MG PO TB24
12.5000 mg | ORAL_TABLET | Freq: Every day | ORAL | Status: DC
  Administered 2017-02-06 – 2017-02-09 (×2): 12.5 mg via ORAL
  Filled 2017-02-06 (×4): qty 1

## 2017-02-06 MED ORDER — LIDOCAINE-EPINEPHRINE-TETRACAINE (LET) SOLUTION
NASAL | Status: AC
  Filled 2017-02-06: qty 3

## 2017-02-06 MED ORDER — CALCIUM CARBONATE ANTACID 500 MG PO CHEW
100.0000 mg | CHEWABLE_TABLET | Freq: Every day | ORAL | Status: DC
  Administered 2017-02-06 – 2017-02-09 (×3): 100 mg via ORAL
  Filled 2017-02-06: qty 1
  Filled 2017-02-06: qty 0.5
  Filled 2017-02-06 (×2): qty 1
  Filled 2017-02-06: qty 0.5

## 2017-02-06 MED ORDER — DONEPEZIL HCL 5 MG PO TABS
5.0000 mg | ORAL_TABLET | Freq: Every day | ORAL | Status: DC
  Administered 2017-02-06 – 2017-02-08 (×3): 5 mg via ORAL
  Filled 2017-02-06 (×6): qty 1

## 2017-02-06 MED ORDER — VITAMIN D3 25 MCG (1000 UNIT) PO TABS
2000.0000 [IU] | ORAL_TABLET | Freq: Every day | ORAL | Status: DC
  Administered 2017-02-06 – 2017-02-09 (×3): 2000 [IU] via ORAL
  Filled 2017-02-06 (×7): qty 2

## 2017-02-06 MED ORDER — TRIPLE ANTIBIOTIC 5-400-5000 EX OINT
1.0000 "application " | TOPICAL_OINTMENT | CUTANEOUS | Status: DC | PRN
  Filled 2017-02-06: qty 1

## 2017-02-06 MED ORDER — ACETAMINOPHEN 325 MG PO TABS: 650.0000 mg | ORAL_TABLET | Freq: Four times a day (QID) | ORAL | Status: DC | PRN

## 2017-02-06 MED ORDER — MORPHINE SULFATE (PF) 2 MG/ML IV SOLN
2.0000 mg | INTRAVENOUS | Status: DC | PRN
  Administered 2017-02-06 – 2017-02-07 (×3): 2 mg via INTRAVENOUS
  Filled 2017-02-06 (×3): qty 1

## 2017-02-06 MED ORDER — TOPIRAMATE 25 MG PO TABS
25.0000 mg | ORAL_TABLET | Freq: Every day | ORAL | Status: DC
  Administered 2017-02-06 – 2017-02-08 (×3): 25 mg via ORAL
  Filled 2017-02-06 (×4): qty 1

## 2017-02-06 MED ORDER — ADULT MULTIVITAMIN W/MINERALS CH
1.0000 | ORAL_TABLET | Freq: Every day | ORAL | Status: DC
  Administered 2017-02-06 – 2017-02-09 (×3): 1 via ORAL
  Filled 2017-02-06 (×3): qty 1

## 2017-02-06 MED ORDER — LORATADINE 10 MG PO TABS
10.0000 mg | ORAL_TABLET | Freq: Every day | ORAL | Status: DC
  Administered 2017-02-06 – 2017-02-09 (×3): 10 mg via ORAL
  Filled 2017-02-06 (×3): qty 1

## 2017-02-06 MED ORDER — LIDOCAINE-EPINEPHRINE-TETRACAINE (LET) SOLUTION: 3.0000 mL | Freq: Once | NASAL | Status: DC

## 2017-02-06 MED ORDER — LEVOTHYROXINE SODIUM 100 MCG PO TABS
100.0000 ug | ORAL_TABLET | Freq: Every day | ORAL | Status: DC
  Administered 2017-02-08 – 2017-02-09 (×2): 100 ug via ORAL
  Filled 2017-02-06 (×2): qty 1

## 2017-02-06 MED ORDER — SACCHAROMYCES BOULARDII 250 MG PO CAPS
250.0000 mg | ORAL_CAPSULE | Freq: Every day | ORAL | Status: DC
  Administered 2017-02-06 – 2017-02-09 (×3): 250 mg via ORAL
  Filled 2017-02-06 (×4): qty 1

## 2017-02-06 MED ORDER — MAGNESIUM HYDROXIDE 400 MG/5ML PO SUSP: 30.0000 mL | Freq: Every evening | ORAL | Status: DC | PRN

## 2017-02-06 MED ORDER — DEXTROSE 5 % IV SOLN
1.0000 g | Freq: Once | INTRAVENOUS | Status: AC
  Administered 2017-02-07: 1 g via INTRAVENOUS
  Filled 2017-02-06: qty 10

## 2017-02-06 MED ORDER — LOPERAMIDE HCL 2 MG PO CAPS: 2.0000 mg | ORAL_CAPSULE | ORAL | Status: DC | PRN

## 2017-02-06 MED ORDER — ONDANSETRON HCL 4 MG/2ML IJ SOLN: 4.0000 mg | Freq: Four times a day (QID) | INTRAMUSCULAR | Status: DC | PRN

## 2017-02-06 MED ORDER — ONDANSETRON HCL 4 MG PO TABS: 4.0000 mg | ORAL_TABLET | Freq: Four times a day (QID) | ORAL | Status: DC | PRN

## 2017-02-06 MED ORDER — ALBUTEROL SULFATE (2.5 MG/3ML) 0.083% IN NEBU: 2.5000 mg | INHALATION_SOLUTION | RESPIRATORY_TRACT | Status: DC | PRN

## 2017-02-06 MED ORDER — FENTANYL CITRATE (PF) 100 MCG/2ML IJ SOLN
25.0000 ug | Freq: Once | INTRAMUSCULAR | Status: AC
  Administered 2017-02-06: 25 ug via INTRAVENOUS
  Filled 2017-02-06: qty 2

## 2017-02-06 MED ORDER — DOCUSATE SODIUM 100 MG PO CAPS
100.0000 mg | ORAL_CAPSULE | Freq: Two times a day (BID) | ORAL | Status: DC
  Administered 2017-02-06 – 2017-02-07 (×3): 100 mg via ORAL
  Filled 2017-02-06 (×3): qty 1

## 2017-02-06 MED ORDER — DIVALPROEX SODIUM 125 MG PO DR TAB
125.0000 mg | DELAYED_RELEASE_TABLET | Freq: Three times a day (TID) | ORAL | Status: DC
  Administered 2017-02-06 – 2017-02-07 (×5): 125 mg via ORAL
  Filled 2017-02-06 (×7): qty 1

## 2017-02-06 NOTE — H&P (Signed)
Donna Goodman is an 83 y.o. female.   Chief Complaint: Fall HPI: The patient with past medical history of dementia, hypertension asthma presents to the emergency department after suffering a fall.  She was found on the floor at her nursing home.  She cannot contribute to her own history as she has dementia but her brother reports that the patient fell sometime last week as well but did not injure herself at that time.  As she was unable to stand the patient was brought to the emergency department for evaluation where she was found to have a right hip fracture on x-ray which prompted the emergency department staff to call the hospitalist service for admission.  Past Medical History:  Diagnosis Date  . Asthma   . COPD (chronic obstructive pulmonary disease) (HCC)   . Dementia   . Hyperlipemia   . Hypertension     Past Surgical History:  Procedure Laterality Date  . BACK SURGERY      No family history on file. Patient has dementia and cannot remember Social History:  reports that she has quit smoking. she has never used smokeless tobacco. She reports that she does not drink alcohol or use drugs.  Allergies: No Known Allergies  Prior to Admission medications   Medication Sig Start Date End Date Taking? Authorizing Provider  acetaminophen (TYLENOL) 500 MG tablet Take 500 mg by mouth 3 (three) times daily.    Yes [provider]  acetaminophen (TYLENOL) 500 MG tablet Take 500 mg by mouth every 8 (eight) hours as needed for mild pain, fever or headache.    Yes [provider]  albuterol (PROVENTIL HFA;VENTOLIN HFA) 108 (90 Base) MCG/ACT inhaler Inhale 2 puffs into the lungs every 6 (six) hours as needed for wheezing or shortness of breath. 02/01/16  Yes Katha Hamming, MD  alum & mag hydroxide-simeth (MAALOX/MYLANTA) 200-200-20 MG/5ML suspension Take 30 mLs by mouth 4 (four) times daily as needed for indigestion or heartburn.    Yes [provider]   atorvastatin (LIPITOR) 20 MG tablet Take 20 mg by mouth at bedtime.   Yes [provider]  calcium citrate (CALCITRATE - DOSED IN MG ELEMENTAL CALCIUM) 950 MG tablet Take 100 mg of elemental calcium by mouth daily.   Yes [provider]  cetirizine (ZYRTEC) 10 MG tablet Take 10 mg by mouth daily.   Yes [provider]  Cholecalciferol (VITAMIN D) 2000 UNITS CAPS Take 2,000 Units by mouth daily.   Yes [provider]  diclofenac sodium (VOLTAREN) 1 % GEL Apply 2 g topically 2 (two) times daily. To right hip for pain    Yes [provider]  divalproex (DEPAKOTE) 125 MG DR tablet Take 125 mg by mouth 3 (three) times daily.   Yes [provider]  donepezil (ARICEPT) 5 MG tablet Take 5 mg by mouth at bedtime.   Yes [provider]  Fluticasone-Salmeterol (ADVAIR) 100-50 MCG/DOSE AEPB Inhale 1 puff into the lungs 2 (two) times daily.   Yes [provider]  guaifenesin (ROBITUSSIN) 100 MG/5ML syrup Take 200 mg by mouth every 6 (six) hours as needed for cough.   Yes [provider]  ibuprofen (ADVIL,MOTRIN) 400 MG tablet Take 400 mg by mouth 3 (three) times daily.   Yes [provider]  levothyroxine (SYNTHROID, LEVOTHROID) 100 MCG tablet Take 100 mcg by mouth daily.    Yes [provider]  loperamide (IMODIUM) 2 MG capsule Take 2 mg by mouth as needed for  diarrhea or loose stools.   Yes [provider]  magnesium hydroxide (MILK OF MAGNESIA) 400 MG/5ML suspension Take 30 mLs by mouth at bedtime as needed for mild constipation.   Yes [provider]  Menthol-Methyl Salicylate (MUSCLE RUB) 10-15 % CREA Apply 1 application topically 3 (three) times daily as needed (arm pain).   Yes [provider]  metoprolol succinate (TOPROL-XL) 25 MG 24 hr tablet Take 12.5 mg by mouth daily.    Yes [provider]  Multiple Vitamin (MULTIVITAMIN WITH MINERALS) TABS tablet Take 1 tablet by  mouth daily.   Yes [provider]  neomycin-bacitracin-polymyxin (NEOSPORIN) [phone] ointment Apply 1 application topically as needed (minor skin tears or abrasion).   Yes [provider]  saccharomyces boulardii (FLORASTOR) 250 MG capsule Take 250 mg by mouth daily.   Yes [provider]  sertraline (ZOLOFT) 50 MG tablet Take 50 mg by mouth daily.   Yes [provider]  topiramate (TOPAMAX) 25 MG tablet Take 25 mg by mouth at bedtime.   Yes [provider]     Results for orders placed or performed during the hospital encounter of 02/06/17 (from the past 48 hour(s))  Urinalysis, Complete w Microscopic     Status: Abnormal   Collection Time: 02/06/17  4:40 AM  Result Value Ref Range   Color, Urine STRAW (A) YELLOW   APPearance CLEAR (A) CLEAR   Specific Gravity, Urine 1.006 1.005 - 1.030   pH 7.0 5.0 - 8.0   Glucose, UA NEGATIVE NEGATIVE mg/dL   Hgb urine dipstick NEGATIVE NEGATIVE   Bilirubin Urine NEGATIVE NEGATIVE   Ketones, ur NEGATIVE NEGATIVE mg/dL   Protein, ur NEGATIVE NEGATIVE mg/dL   Nitrite NEGATIVE NEGATIVE   Leukocytes, UA NEGATIVE NEGATIVE   RBC / HPF NONE SEEN 0 - 5 RBC/hpf   WBC, UA 0-5 0 - 5 WBC/hpf   Bacteria, UA NONE SEEN NONE SEEN   Squamous Epithelial / LPF NONE SEEN NONE SEEN   Dg Shoulder Right  Result Date: 02/06/2017 CLINICAL DATA:  Unwitnessed fall in the shower, right shoulder, elbow and hip pain. EXAM: RIGHT SHOULDER - 2+ VIEW COMPARISON:  None. FINDINGS: No acute fracture or dislocation. Glenohumeral osteoarthritis, at least moderate in degree. Mild acromioclavicular degenerative change. IMPRESSION: Degenerative change of the right shoulder without acute fracture or subluxation. Electronically Signed   By: Rubye OaksMelanie  Ehinger M.D.   On: 02/06/2017 05:19   Dg Elbow Complete Right  Result Date: 02/06/2017 CLINICAL DATA:  Unwitnessed fall in the shower, right shoulder, elbow and hip pain. EXAM: RIGHT ELBOW -  COMPLETE 3+ VIEW COMPARISON:  None. FINDINGS: There is no evidence of fracture, dislocation, or joint effusion. There is no evidence of arthropathy or other focal bone abnormality. Focal soft tissue prominence overlies the olecranon. IMPRESSION: 1. No fracture, dislocation, or joint effusion. 2. Focal soft tissue prominence overlies the olecranon, likely hematoma in the setting of injury. Alternatively, olecranon bursitis could have a similar appearance, recommend correlation for acuity. Electronically Signed   By: Rubye OaksMelanie  Ehinger M.D.   On: 02/06/2017 05:21   Dg Chest Port 1 View  Result Date: 02/06/2017 CLINICAL DATA:  Fall, right hip fracture. EXAM: PORTABLE CHEST 1 VIEW COMPARISON:  Radiograph 05/25/2016 FINDINGS: The cardiomediastinal contours are normal. Previous streaky left lung base opacity has resolved. Pulmonary vasculature is normal. No consolidation, pleural effusion, or pneumothorax. No acute osseous abnormalities are seen. Chronic change of the right shoulder. IMPRESSION: No acute abnormality. Electronically Signed  By: Rubye OaksMelanie  Ehinger M.D.   On: 02/06/2017 05:59   Dg Hip Unilat With Pelvis 2-3 Views Right  Result Date: 02/06/2017 CLINICAL DATA:  Unwitnessed fall in the shower, right shoulder, elbow and hip pain. EXAM: DG HIP (WITH OR WITHOUT PELVIS) 2-3V RIGHT COMPARISON:  Radiographs 01/21/2015 FINDINGS: Impacted subcapital right femoral neck fracture with mild displacement. Femoral head remains seated. Pubic rami are intact. No additional fracture of the pelvis. The bones are under mineralized. IMPRESSION: Impacted subcapital right femoral neck fracture. Electronically Signed   By: Rubye OaksMelanie  Ehinger M.D.   On: 02/06/2017 05:20    Review of Systems  Constitutional: Negative for chills and fever.  HENT: Negative for sore throat and tinnitus.   Eyes: Negative for blurred vision and redness.  Respiratory: Positive for cough, shortness of breath and wheezing. Negative for sputum  production.   Cardiovascular: Negative for chest pain, palpitations, orthopnea and PND.  Gastrointestinal: Negative for abdominal pain, diarrhea, nausea and vomiting.  Genitourinary: Negative for dysuria, frequency and urgency.  Musculoskeletal: Negative for joint pain and myalgias.  Skin: Negative for rash.       No lesions  Neurological: Negative for speech change, focal weakness and weakness.  Endo/Heme/Allergies: Does not bruise/bleed easily.       No temperature intolerance  Psychiatric/Behavioral: Negative for depression and suicidal ideas.    Blood pressure (!) 131/96, pulse (!) 53, temperature (!) 97.5 F (36.4 C), temperature source Oral, resp. rate 20, height 5\' 5"  (1.651 m), weight 53.3 kg (117 lb 8.1 oz), SpO2 99 %. Physical Exam  Vitals reviewed. Constitutional: She is oriented to person, place, and time. She appears well-developed and well-nourished. No distress.  HENT:  Head: Normocephalic and atraumatic.  Mouth/Throat: Oropharynx is clear and moist.  Eyes: Conjunctivae and EOM are normal. Pupils are equal, round, and reactive to light. No scleral icterus.  Neck: Normal range of motion. Neck supple. No JVD present. No tracheal deviation present. No thyromegaly present.  Cardiovascular: Normal rate, regular rhythm and normal heart sounds. Exam reveals no gallop and no friction rub.  No murmur heard. Respiratory: Breath sounds normal.  GI: Soft. Bowel sounds are normal. She exhibits no distension. There is no tenderness.  Genitourinary:  Genitourinary Comments: Deferred  Musculoskeletal: Normal range of motion. She exhibits no edema.  Lymphadenopathy:    She has no cervical adenopathy.  Neurological: She is alert and oriented to person, place, and time. No cranial nerve deficit. She exhibits normal muscle tone.  Skin: Skin is warm and dry. No rash noted. No erythema.  Psychiatric: She has a normal mood and affect. Thought content normal. She is agitated. She exhibits  abnormal recent memory.     Assessment/Plan This is an 83 year old female admitted for hip fracture. 1.  Hip fracture: Impacted right femoral neck fracture.  Manage pain IV morphine.  Immobilize per orthopedic recommendations.  The patient is n.p.o. in case she needs surgery this morning. 2.  COPD: Without exacerbation; continue inhaled corticosteroid.  Albuterol as needed. 3.  Hypertension: Controlled age; continue metoprolol   4.  Dementia: Continue Aricept 5.  Hypothyroidism: Check TSH; continue Synthroid. 6.  Hyperlipidemia: Continue statin therapy 7.  DVT prophylaxis: SCDs 8.  GI prophylaxis: None The patient is a DNR.  Time spent on admission orders and patient care proximally 45 minutes  Arnaldo Nataliamond,  Liem Copenhaver S, MD 02/06/2017, 6:23 AM

## 2017-02-06 NOTE — ED Notes (Signed)
This RN to bedside at this time. Pt resting in bed with family at bedside. VSS at this time. Apologized for and explained delay to patient and family. Pt's brother states understanding at this time.

## 2017-02-06 NOTE — Consult Note (Signed)
ORTHOPAEDIC CONSULTATION  REQUESTING PHYSICIAN: Adrian SaranMody, Sital, MD  Chief Complaint: right hip pain  HPI: Donna Goodman is a 83 y.o. female who complains of  Right hip pain. History is obtained from her son at the bedside.  Past Medical History:  Diagnosis Date  . Asthma   . COPD (chronic obstructive pulmonary disease) (HCC)   . Dementia   . Hyperlipemia   . Hypertension    Past Surgical History:  Procedure Laterality Date  . BACK SURGERY     Social History   Socioeconomic History  . Marital status: Widowed    Spouse name: None  . Number of children: None  . Years of education: None  . Highest education level: None  Social Needs  . Financial resource strain: None  . Food insecurity - worry: None  . Food insecurity - inability: None  . Transportation needs - medical: None  . Transportation needs - non-medical: None  Occupational History  . None  Tobacco Use  . Smoking status: Former Games developermoker  . Smokeless tobacco: Never Used  Substance and Sexual Activity  . Alcohol use: No  . Drug use: No  . Sexual activity: None  Other Topics Concern  . None  Social History Narrative  . None   History reviewed. No pertinent family history. No Known Allergies Prior to Admission medications   Medication Sig Start Date End Date Taking? Authorizing Provider  acetaminophen (TYLENOL) 500 MG tablet Take 500 mg by mouth 3 (three) times daily.    Yes [provider]  acetaminophen (TYLENOL) 500 MG tablet Take 500 mg by mouth every 8 (eight) hours as needed for mild pain, fever or headache.    Yes [provider]  albuterol (PROVENTIL HFA;VENTOLIN HFA) 108 (90 Base) MCG/ACT inhaler Inhale 2 puffs into the lungs every 6 (six) hours as needed for wheezing or shortness of breath. 02/01/16  Yes Katha HammingKonidena, Snehalatha, MD  alum & mag hydroxide-simeth (MAALOX/MYLANTA) 200-200-20 MG/5ML suspension Take 30 mLs by mouth 4 (four) times daily as needed for indigestion or heartburn.     Yes [provider]  atorvastatin (LIPITOR) 20 MG tablet Take 20 mg by mouth at bedtime.   Yes [provider]  calcium citrate (CALCITRATE - DOSED IN MG ELEMENTAL CALCIUM) 950 MG tablet Take 100 mg of elemental calcium by mouth daily.   Yes [provider]  cetirizine (ZYRTEC) 10 MG tablet Take 10 mg by mouth daily.   Yes [provider]  Cholecalciferol (VITAMIN D) 2000 UNITS CAPS Take 2,000 Units by mouth daily.   Yes [provider]  diclofenac sodium (VOLTAREN) 1 % GEL Apply 2 g topically 2 (two) times daily. To right hip for pain    Yes [provider]  divalproex (DEPAKOTE) 125 MG DR tablet Take 125 mg by mouth 3 (three) times daily.   Yes [provider]  donepezil (ARICEPT) 5 MG tablet Take 5 mg by mouth at bedtime.   Yes [provider]  Fluticasone-Salmeterol (ADVAIR) 100-50 MCG/DOSE AEPB Inhale 1 puff into the lungs 2 (two) times daily.   Yes [provider]  guaifenesin (ROBITUSSIN) 100 MG/5ML syrup Take 200 mg by mouth every 6 (six) hours as needed for cough.   Yes [provider]  ibuprofen (ADVIL,MOTRIN) 400 MG tablet Take 400 mg by mouth 3 (three) times daily.   Yes [provider]  levothyroxine (SYNTHROID, LEVOTHROID) 100 MCG tablet Take 100 mcg by mouth daily.    Yes [provider]  loperamide (IMODIUM) 2 MG capsule Take 2 mg by mouth as needed for diarrhea or loose stools.   Yes [provider]  magnesium hydroxide (MILK OF MAGNESIA) 400 MG/5ML suspension Take 30 mLs by mouth at bedtime as needed for mild constipation.   Yes [provider]  Menthol-Methyl Salicylate (MUSCLE RUB) 10-15 % CREA Apply 1 application topically 3 (three) times daily as needed (arm pain).   Yes [provider]  metoprolol succinate (TOPROL-XL) 25 MG 24 hr tablet Take 12.5 mg by mouth daily.    Yes [provider]  Multiple Vitamin (MULTIVITAMIN WITH MINERALS)  TABS tablet Take 1 tablet by mouth daily.   Yes [provider]  neomycin-bacitracin-polymyxin (NEOSPORIN) [phone] ointment Apply 1 application topically as needed (minor skin tears or abrasion).   Yes [provider]  saccharomyces boulardii (FLORASTOR) 250 MG capsule Take 250 mg by mouth daily.   Yes [provider]  sertraline (ZOLOFT) 50 MG tablet Take 50 mg by mouth daily.   Yes [provider]  topiramate (TOPAMAX) 25 MG tablet Take 25 mg by mouth at bedtime.   Yes [provider]   Dg Shoulder Right  Result Date: 02/06/2017 CLINICAL DATA:  Unwitnessed fall in the shower, right shoulder, elbow and hip pain. EXAM: RIGHT SHOULDER - 2+ VIEW COMPARISON:  None. FINDINGS: No acute fracture or dislocation. Glenohumeral osteoarthritis, at least moderate in degree. Mild acromioclavicular degenerative change. IMPRESSION: Degenerative change of the right shoulder without acute fracture or subluxation. Electronically Signed   By: Rubye Oaks M.D.   On: 02/06/2017 05:19   Dg Elbow Complete Right  Result Date: 02/06/2017 CLINICAL DATA:  Unwitnessed fall in the shower, right shoulder, elbow and hip pain. EXAM: RIGHT ELBOW - COMPLETE 3+ VIEW COMPARISON:  None. FINDINGS: There is no evidence of fracture, dislocation, or joint effusion. There is no evidence of arthropathy or other focal bone abnormality. Focal soft tissue prominence overlies the olecranon. IMPRESSION: 1. No fracture, dislocation, or joint effusion. 2. Focal soft tissue prominence overlies the olecranon, likely hematoma in the setting of injury. Alternatively, olecranon bursitis could have a similar appearance, recommend correlation for acuity. Electronically Signed   By: Rubye Oaks M.D.   On: 02/06/2017 05:21   Dg Chest Port 1 View  Result Date: 02/06/2017 CLINICAL DATA:  Fall, right hip fracture. EXAM: PORTABLE CHEST 1 VIEW COMPARISON:  Radiograph 05/25/2016 FINDINGS: The  cardiomediastinal contours are normal. Previous streaky left lung base opacity has resolved. Pulmonary vasculature is normal. No consolidation, pleural effusion, or pneumothorax. No acute osseous abnormalities are seen. Chronic change of the right shoulder. IMPRESSION: No acute abnormality. Electronically Signed   By: Rubye Oaks M.D.   On: 02/06/2017 05:59   Dg Hip Unilat With Pelvis 2-3 Views Right  Result Date: 02/06/2017 CLINICAL DATA:  Unwitnessed fall in the shower, right shoulder, elbow and hip pain. EXAM: DG HIP (WITH OR WITHOUT PELVIS) 2-3V RIGHT COMPARISON:  Radiographs 01/21/2015 FINDINGS: Impacted subcapital right femoral neck fracture with mild displacement. Femoral head remains seated. Pubic rami are intact. No additional fracture of the pelvis. The bones are under mineralized. IMPRESSION: Impacted subcapital right femoral neck fracture. Electronically Signed   By: Rubye Oaks M.D.   On: 02/06/2017 05:20    Positive ROS: All other systems have been reviewed and were otherwise negative with the exception of those mentioned in the HPI and as above.  Physical Exam: General: Alert, no acute distress Cardiovascular: No pedal edema Respiratory: No cyanosis, no  use of accessory musculature GI: No organomegaly, abdomen is soft and non-tender Skin: No lesions in the area of chief complaint Neurologic: Sensation intact distally Psychiatric: Patient is competent for consent with normal mood and affect Lymphatic: No axillary or cervical lymphadenopathy  MUSCULOSKELETAL: right lower leg pain with IR/ER, short and externally rotated  Assessment: Impacted femoral neck fracture right hip  Plan: Plan a closed reduction and percutaneous pinning of the right hip today.   The diagnosis, risks, benefits and alternatives to treatment are all discussed in detail with the patient and family including her son who is the power of attorney. Risks include but are not limited to bleeding,  infection, deep vein thrombosis, pulmonary embolism, nerve or vascular injury, non-union, repeat operation, persistent pain, weakness, stiffness and death. The understand and are eager to proceed.     Lyndle HerrlichJames R Nikole Swartzentruber, MD    02/06/2017 9:04 AM

## 2017-02-06 NOTE — ED Provider Notes (Signed)
[facility] Emergency Department Provider Note   ____________________________________________   First MD Initiated Contact with Patient 02/06/17 [id number]     (approximate)  I have reviewed the triage vital signs and the nursing notes.   HISTORY  Chief Complaint Fall  Level 5 caveat: History limited by dementia History obtained from family members  HPI Donna Goodman is a 83 y.o. female brought to the ED from [facility] via EMS with a chief complaint of unwitnessed fall with right-sided pain.  Patient was found naked in the shower laying on her right side.  Complains of right shoulder, elbow and hip pain.  Does not think she struck her head.  Is not on anticoagulants.  Denies neck pain, chest pain, shortness of breath, abdominal pain, nausea, vomiting.   Past Medical History:  Diagnosis Date  . Asthma   . COPD (chronic obstructive pulmonary disease) (HCC)   . Dementia   . Hyperlipemia   . Hypertension     Patient Active Problem List   Diagnosis Date Noted  . Multifocal pneumonia 01/29/2016  . SOB (shortness of breath) 01/29/2016  . Cough, persistent 01/29/2016  . COPD with acute exacerbation (HCC) 01/29/2016    Past Surgical History:  Procedure Laterality Date  . BACK SURGERY      Prior to Admission medications   Medication Sig Start Date End Date Taking? Authorizing Provider  acetaminophen (TYLENOL) 500 MG tablet Take 500 mg by mouth 3 (three) times daily.    Yes [provider]  acetaminophen (TYLENOL) 500 MG tablet Take 500 mg by mouth every 8 (eight) hours as needed for mild pain, fever or headache.    Yes [provider]  albuterol (PROVENTIL HFA;VENTOLIN HFA) 108 (90 Base) MCG/ACT inhaler Inhale 2 puffs into the lungs every 6 (six) hours as needed for wheezing or shortness of breath. 02/01/16  Yes Katha Hamming, MD  alum & mag hydroxide-simeth (MAALOX/MYLANTA) 200-200-20 MG/5ML suspension Take 30 mLs by mouth 4  (four) times daily as needed for indigestion or heartburn.    Yes [provider]  atorvastatin (LIPITOR) 20 MG tablet Take 20 mg by mouth at bedtime.   Yes [provider]  calcium citrate (CALCITRATE - DOSED IN MG ELEMENTAL CALCIUM) 950 MG tablet Take 100 mg of elemental calcium by mouth daily.   Yes [provider]  cetirizine (ZYRTEC) 10 MG tablet Take 10 mg by mouth daily.   Yes [provider]  Cholecalciferol (VITAMIN D) 2000 UNITS CAPS Take 2,000 Units by mouth daily.   Yes [provider]  diclofenac sodium (VOLTAREN) 1 % GEL Apply 2 g topically 2 (two) times daily. To right hip for pain    Yes [provider]  divalproex (DEPAKOTE) 125 MG DR tablet Take 125 mg by mouth 3 (three) times daily.   Yes [provider]  donepezil (ARICEPT) 5 MG tablet Take 5 mg by mouth at bedtime.   Yes [provider]  Fluticasone-Salmeterol (ADVAIR) 100-50 MCG/DOSE AEPB Inhale 1 puff into the lungs 2 (two) times daily.   Yes [provider]  guaifenesin (ROBITUSSIN) 100 MG/5ML syrup Take 200 mg by mouth every 6 (six) hours as needed for cough.   Yes [provider]  ibuprofen (ADVIL,MOTRIN) 400 MG tablet Take 400 mg by mouth 3 (three) times daily.   Yes [provider]  levothyroxine (SYNTHROID, LEVOTHROID) 100 MCG tablet Take 100 mcg by mouth daily.    Yes [provider]  loperamide (  IMODIUM) 2 MG capsule Take 2 mg by mouth as needed for diarrhea or loose stools.   Yes [provider]  magnesium hydroxide (MILK OF MAGNESIA) 400 MG/5ML suspension Take 30 mLs by mouth at bedtime as needed for mild constipation.   Yes [provider]  Menthol-Methyl Salicylate (MUSCLE RUB) 10-15 % CREA Apply 1 application topically 3 (three) times daily as needed (arm pain).   Yes [provider]  metoprolol succinate (TOPROL-XL) 25 MG 24 hr tablet Take 12.5 mg by mouth daily.    Yes [provider]  Multiple Vitamin (MULTIVITAMIN WITH MINERALS) TABS tablet Take 1 tablet by mouth daily.   Yes [provider]  neomycin-bacitracin-polymyxin (NEOSPORIN) [phone] ointment Apply 1 application topically as needed (minor skin tears or abrasion).   Yes [provider]  saccharomyces boulardii (FLORASTOR) 250 MG capsule Take 250 mg by mouth daily.   Yes [provider]  sertraline (ZOLOFT) 50 MG tablet Take 50 mg by mouth daily.   Yes [provider]  topiramate (TOPAMAX) 25 MG tablet Take 25 mg by mouth at bedtime.   Yes [provider]    Allergies Patient has no known allergies.  No family history on file.  Social History Social History   Tobacco Use  . Smoking status: Former Games developermoker  . Smokeless tobacco: Never Used  Substance Use Topics  . Alcohol use: No  . Drug use: No    Review of Systems  Constitutional: No fever/chills. Eyes: No visual changes. ENT: No sore throat. Cardiovascular: Denies chest pain. Respiratory: Denies shortness of breath. Gastrointestinal: No abdominal pain.  No nausea, no vomiting.  No diarrhea.  No constipation. Genitourinary: Negative for dysuria. Musculoskeletal: Positive for right shoulder, elbow and hip pain.  Negative for back pain. Skin: Negative for rash. Neurological: Negative for headaches, focal weakness or numbness.   ____________________________________________   PHYSICAL EXAM:  VITAL SIGNS: ED Triage Vitals  Enc Vitals Group     BP 02/06/17 0318 (!) 177/81     Pulse Rate 02/06/17 0318 (!) 55     Resp 02/06/17 0318 18     Temp 02/06/17 0318 (!) 97.5 F (36.4 C)     Temp Source 02/06/17 0318 Oral     SpO2 02/06/17 0318 99 %     Weight 02/06/17 0319 117 lb 8.1 oz (53.3 kg)     Height 02/06/17 0319 5\' 5"  (1.651 m)     Head Circumference --      Peak Flow --      Pain Score --      Pain Loc --      Pain Edu? --      Excl. in GC? --     Constitutional: Alert and  oriented. Well appearing and in mild acute distress. Eyes: Conjunctivae are normal. PERRL. EOMI. Head: Atraumatic. Nose: No congestion/rhinnorhea. Mouth/Throat: Mucous membranes are moist.  Oropharynx non-erythematous. Neck: No stridor.  No cervical spine tenderness to palpation. Cardiovascular: Normal rate, regular rhythm. Grossly normal heart sounds.  Good peripheral circulation. Respiratory: Normal respiratory effort.  No retractions. Lungs CTAB. Gastrointestinal: Soft and nontender. No distention. No abdominal bruits. No CVA tenderness. Musculoskeletal:  No spinal tenderness to palpation.  Pelvis stable.   RUE: Shoulder and elbow tender to palpation but with full range of motion.  Tiny skin tear to upper triceps area.  2+ radial pulses.  Brisk, less than 5-second capillary refill. RLE: Right groin tender to palpation.  Limited range of motion secondary to  pain. Right lower extremity is not shortened nor rotated.  2+ femoral and distal pulses.  Brisk, less than 5-second capillary refill. Neurologic: Alert and oriented x2 which is baseline per family.  Normal speech and language. No gross focal neurologic deficits are appreciated.  Skin:  Skin is warm, dry and intact. No rash noted. Psychiatric: Mood and affect are normal. Speech and behavior are normal.  ____________________________________________   LABS (all labs ordered are listed, but only abnormal results are displayed)  Labs Reviewed  URINALYSIS, COMPLETE (UACMP) WITH MICROSCOPIC - Abnormal; Notable for the following components:      Result Value   Color, Urine STRAW (*)    APPearance CLEAR (*)    All other components within normal limits  CBC WITH DIFFERENTIAL/PLATELET  BASIC METABOLIC PANEL   ____________________________________________  EKG  ED ECG REPORT I, SUNG,JADE J, the attending physician, personally viewed and interpreted this ECG.   Date: 02/06/2017  EKG Time: 0317  Rate: 51  Rhythm: sinus bradycardia   Axis: LAD  Intervals:none  ST&T Change: Nonspecific  ____________________________________________  RADIOLOGY  Dg Shoulder Right  Result Date: 02/06/2017 CLINICAL DATA:  Unwitnessed fall in the shower, right shoulder, elbow and hip pain. EXAM: RIGHT SHOULDER - 2+ VIEW COMPARISON:  None. FINDINGS: No acute fracture or dislocation. Glenohumeral osteoarthritis, at least moderate in degree. Mild acromioclavicular degenerative change. IMPRESSION: Degenerative change of the right shoulder without acute fracture or subluxation. Electronically Signed   By: Rubye Oaks M.D.   On: 02/06/2017 05:19   Dg Elbow Complete Right  Result Date: 02/06/2017 CLINICAL DATA:  Unwitnessed fall in the shower, right shoulder, elbow and hip pain. EXAM: RIGHT ELBOW - COMPLETE 3+ VIEW COMPARISON:  None. FINDINGS: There is no evidence of fracture, dislocation, or joint effusion. There is no evidence of arthropathy or other focal bone abnormality. Focal soft tissue prominence overlies the olecranon. IMPRESSION: 1. No fracture, dislocation, or joint effusion. 2. Focal soft tissue prominence overlies the olecranon, likely hematoma in the setting of injury. Alternatively, olecranon bursitis could have a similar appearance, recommend correlation for acuity. Electronically Signed   By: Rubye Oaks M.D.   On: 02/06/2017 05:21   Dg Hip Unilat With Pelvis 2-3 Views Right  Result Date: 02/06/2017 CLINICAL DATA:  Unwitnessed fall in the shower, right shoulder, elbow and hip pain. EXAM: DG HIP (WITH OR WITHOUT PELVIS) 2-3V RIGHT COMPARISON:  Radiographs 01/21/2015 FINDINGS: Impacted subcapital right femoral neck fracture with mild displacement. Femoral head remains seated. Pubic rami are intact. No additional fracture of the pelvis. The bones are under mineralized. IMPRESSION: Impacted subcapital right femoral neck fracture. Electronically Signed   By: Rubye Oaks M.D.   On: 02/06/2017 05:20     ____________________________________________   PROCEDURES  Procedure(s) performed: None  Procedures  Critical Care performed: No  ____________________________________________   INITIAL IMPRESSION / ASSESSMENT AND PLAN / ED COURSE  As part of my medical decision making, I reviewed the following data within the electronic MEDICAL RECORD NUMBER History obtained from family, Nursing notes reviewed and incorporated, Labs reviewed, EKG interpreted, Old chart reviewed, Radiograph reviewed, Discussed with admitting physician, A consult was requested and obtained from this/these consultant(s) Orthopedics and Notes from prior ED visits.   83 year old female who presents status post unwitnessed fall at nursing facility with right shoulder, elbow and hip pain.  Will obtain plain film x-rays of these areas.  Patient declines analgesia presently.  Clinical Course as of Feb 06 545  Wed Feb 06, 2017  6045  Discussed with Dr. Odis LusterBowers from orthopedics; spoke with hospitalist Dr. Sheryle Hailiamond who will evaluate patient in the emergency department for admission.  [JS]    Clinical Course User Index [JS] Irean HongSung, Jade J, MD     ____________________________________________   FINAL CLINICAL IMPRESSION(S) / ED DIAGNOSES  Final diagnoses:  Closed fracture of neck of right femur, initial encounter [facility](HCC)  Fall, initial encounter     ED Discharge Orders    None       Note:  This document was prepared using Dragon voice recognition software and may include unintentional dictation errors.    Irean HongSung, Jade J, MD 02/06/17 [phone]

## 2017-02-06 NOTE — ED Triage Notes (Signed)
Patient to ER for c/o right sided hip and right sided elbow pain after unwitnessed fall. Patient was found in the shower. Patient does not recall events surrounding fall.

## 2017-02-06 NOTE — Anesthesia Preprocedure Evaluation (Addendum)
Anesthesia Evaluation  Patient identified by MRN, date of birth, ID band Patient awake and Patient confused    Reviewed: Allergy & Precautions, NPO status , Patient's Chart, lab work & pertinent test results, reviewed documented beta blocker date and time   Airway Mallampati: II  TM Distance: >3 FB     Dental  (+) Chipped   Pulmonary asthma , pneumonia, COPD,  COPD inhaler, former smoker,           Cardiovascular hypertension, Pt. on medications      Neuro/Psych PSYCHIATRIC DISORDERS negative neurological ROS     GI/Hepatic negative GI ROS, Neg liver ROS,   Endo/Other  negative endocrine ROS  Renal/GU negative Renal ROS     Musculoskeletal  (+) Arthritis , Osteoarthritis,    Abdominal   Peds negative pediatric ROS (+)  Hematology negative hematology ROS (+)   Anesthesia Other Findings   Reproductive/Obstetrics                            Anesthesia Physical Anesthesia Plan  ASA: III  Anesthesia Plan: Spinal   Post-op Pain Management:    Induction:   PONV Risk Score and Plan:   Airway Management Planned:   Additional Equipment:   Intra-op Plan:   Post-operative Plan:   Informed Consent:   Plan Discussed with:   Anesthesia Plan Comments:        Anesthesia Quick Evaluation

## 2017-02-06 NOTE — Progress Notes (Signed)
Patient admitted this morning for hip fracture. Plan for closed reduction and percutaneous pinning of right hip Agree with admitting M.D. plan

## 2017-02-07 ENCOUNTER — Inpatient Hospital Stay: Payer: Medicare Other | Admitting: Anesthesiology

## 2017-02-07 ENCOUNTER — Encounter
Admission: EM | Disposition: A | Discharge status: Skilled Nursing Facility | Payer: Self-pay | Source: Home / Self Care | Attending: Internal Medicine

## 2017-02-07 ENCOUNTER — Encounter: Payer: Self-pay | Admitting: Anesthesiology

## 2017-02-07 ENCOUNTER — Inpatient Hospital Stay: Payer: Medicare Other

## 2017-02-07 HISTORY — PX: HIP PINNING,CANNULATED: SHX1758

## 2017-02-07 SURGERY — FIXATION, FEMUR, NECK, PERCUTANEOUS, USING SCREW
Anesthesia: Spinal | Laterality: Right

## 2017-02-07 MED ORDER — DEXTROSE 5 % IV SOLN
1.0000 g | Freq: Four times a day (QID) | INTRAVENOUS | Status: AC
  Administered 2017-02-07 – 2017-02-08 (×3): 1 g via INTRAVENOUS
  Filled 2017-02-07 (×3): qty 10

## 2017-02-07 MED ORDER — SODIUM CHLORIDE 0.9 % IR SOLN
Status: DC | PRN
  Administered 2017-02-07: 1000 mL

## 2017-02-07 MED ORDER — HYDROCODONE-ACETAMINOPHEN 5-325 MG PO TABS: 2.0000 | ORAL_TABLET | ORAL | Status: DC | PRN

## 2017-02-07 MED ORDER — LABETALOL HCL 5 MG/ML IV SOLN
INTRAVENOUS | Status: DC | PRN
  Administered 2017-02-07: 2.5 mg via INTRAVENOUS

## 2017-02-07 MED ORDER — MIDAZOLAM HCL 2 MG/2ML IJ SOLN
INTRAMUSCULAR | Status: AC
  Filled 2017-02-07: qty 2

## 2017-02-07 MED ORDER — PROPOFOL 500 MG/50ML IV EMUL
INTRAVENOUS | Status: DC | PRN
  Administered 2017-02-07: 25 ug/kg/min via INTRAVENOUS

## 2017-02-07 MED ORDER — CEFAZOLIN SODIUM-DEXTROSE 1-4 GM/50ML-% IV SOLN: 1.0000 g | Freq: Four times a day (QID) | INTRAVENOUS | Status: DC

## 2017-02-07 MED ORDER — FENTANYL CITRATE (PF) 100 MCG/2ML IJ SOLN: 25.0000 ug | INTRAMUSCULAR | Status: DC | PRN

## 2017-02-07 MED ORDER — MIDAZOLAM HCL 5 MG/5ML IJ SOLN
INTRAMUSCULAR | Status: DC | PRN
  Administered 2017-02-07: 1 mg via INTRAVENOUS

## 2017-02-07 MED ORDER — METOCLOPRAMIDE HCL 5 MG/ML IJ SOLN: 5.0000 mg | Freq: Three times a day (TID) | INTRAMUSCULAR | Status: DC | PRN

## 2017-02-07 MED ORDER — KETAMINE HCL 50 MG/ML IJ SOLN
INTRAMUSCULAR | Status: AC
  Filled 2017-02-07: qty 10

## 2017-02-07 MED ORDER — KETAMINE HCL 50 MG/ML IJ SOLN
INTRAMUSCULAR | Status: DC | PRN
  Administered 2017-02-07: 50 mg via INTRAMUSCULAR

## 2017-02-07 MED ORDER — LACTATED RINGERS IV SOLN
INTRAVENOUS | Status: DC | PRN
  Administered 2017-02-07: 08:00:00 via INTRAVENOUS

## 2017-02-07 MED ORDER — METOCLOPRAMIDE HCL 10 MG PO TABS: 5.0000 mg | ORAL_TABLET | Freq: Three times a day (TID) | ORAL | Status: DC | PRN

## 2017-02-07 MED ORDER — PROPOFOL 10 MG/ML IV BOLUS
INTRAVENOUS | Status: DC | PRN
  Administered 2017-02-07: 30 mg via INTRAVENOUS

## 2017-02-07 MED ORDER — ONDANSETRON HCL 4 MG/2ML IJ SOLN: 4.0000 mg | Freq: Once | INTRAMUSCULAR | Status: DC | PRN

## 2017-02-07 MED ORDER — HYDROCODONE-ACETAMINOPHEN 5-325 MG PO TABS
1.0000 | ORAL_TABLET | ORAL | Status: DC | PRN
  Administered 2017-02-07 – 2017-02-08 (×3): 1 via ORAL
  Filled 2017-02-07 (×3): qty 1

## 2017-02-07 MED ORDER — PROPOFOL 10 MG/ML IV BOLUS
INTRAVENOUS | Status: AC
  Filled 2017-02-07: qty 20

## 2017-02-07 MED ORDER — LACTATED RINGERS IV SOLN: INTRAVENOUS | Status: DC

## 2017-02-07 MED ORDER — BUPIVACAINE IN DEXTROSE 0.75-8.25 % IT SOLN
INTRATHECAL | Status: DC | PRN
  Administered 2017-02-07: 1.6 mL via INTRATHECAL

## 2017-02-07 SURGICAL SUPPLY — 33 items
BIT DRILL CANN LRG QC 5X300 (BIT) ×3 IMPLANT
BLADE SURG SZ10 CARB STEEL (BLADE) ×3 IMPLANT
BNDG COHESIVE 6X5 TAN STRL LF (GAUZE/BANDAGES/DRESSINGS) ×6 IMPLANT
BRUSH SCRUB EZ  4% CHG (MISCELLANEOUS) ×4
BRUSH SCRUB EZ 4% CHG (MISCELLANEOUS) ×2 IMPLANT
CANISTER SUCT 1200ML W/VALVE (MISCELLANEOUS) ×3 IMPLANT
CHLORAPREP W/TINT 26ML (MISCELLANEOUS) ×3 IMPLANT
DRSG AQUACEL AG ADV 3.5X10 (GAUZE/BANDAGES/DRESSINGS) ×3 IMPLANT
ELECT REM PT RETURN 9FT ADLT (ELECTROSURGICAL) ×3
ELECTRODE REM PT RTRN 9FT ADLT (ELECTROSURGICAL) ×1 IMPLANT
GAUZE PETRO XEROFOAM 1X8 (MISCELLANEOUS) ×3 IMPLANT
GAUZE SPONGE 4X4 12PLY STRL (GAUZE/BANDAGES/DRESSINGS) ×3 IMPLANT
GLOVE INDICATOR 8.0 STRL GRN (GLOVE) ×3 IMPLANT
GLOVE SURG ORTHO 8.0 STRL STRW (GLOVE) ×6 IMPLANT
GOWN STRL REUS W/ TWL LRG LVL3 (GOWN DISPOSABLE) ×1 IMPLANT
GOWN STRL REUS W/ TWL XL LVL3 (GOWN DISPOSABLE) ×1 IMPLANT
GOWN STRL REUS W/TWL LRG LVL3 (GOWN DISPOSABLE) ×2
GOWN STRL REUS W/TWL XL LVL3 (GOWN DISPOSABLE) ×2
GUIDEWIRE THREADED 2.8 (WIRE) ×9 IMPLANT
HOLDER FOLEY CATH W/STRAP (MISCELLANEOUS) IMPLANT
KIT RM TURNOVER CYSTO AR (KITS) ×3 IMPLANT
MAT BLUE FLOOR 46X72 FLO (MISCELLANEOUS) ×3 IMPLANT
NS IRRIG 1000ML POUR BTL (IV SOLUTION) ×3 IMPLANT
PACK HIP COMPR (MISCELLANEOUS) ×3 IMPLANT
SCREW CANN TI 16 THRD 6.5X65 (Screw) ×3 IMPLANT
SCREW CANN TI 16 THRD 6.5X70 (Screw) ×3 IMPLANT
SCREW CANN TI 16 THRD 6.5X75 (Screw) ×3 IMPLANT
STAPLER SKIN PROX 35W (STAPLE) ×3 IMPLANT
SUT VIC AB 0 CT1 36 (SUTURE) ×3 IMPLANT
SUT VIC AB 2-0 CT1 (SUTURE) ×3 IMPLANT
SUT VIC AB 2-0 CT2 27 (SUTURE) ×3 IMPLANT
TRAY FOLEY W/METER SILVER 16FR (SET/KITS/TRAYS/PACK) IMPLANT
WATER STERILE IRR 1000ML POUR (IV SOLUTION) ×3 IMPLANT

## 2017-02-07 NOTE — Progress Notes (Signed)
Unable to determine level of spinal due to dementia

## 2017-02-07 NOTE — Clinical Social Work Note (Addendum)
Clinical Social Work Assessment  Patient Details  Name: Donna Goodman N Wisher MRN: [id number] Date of Birth: 1927/03/14  Date of referral:  02/07/17               Reason for consult:  Facility Placement                Permission sought to share information with:  Oceanographeracility Contact Representative Permission granted to share information::  Yes, Verbal Permission Granted  Name::      Skilled Nursing Facility   Agency::   [place]   Relationship::     Contact Information:     Housing/Transportation Living arrangements for the past 2 months:  Assisted Living Facility Source of Information:  Other (Comment Required)(Brother Child psychotherapistCecil. ) Patient Interpreter Needed:  None Criminal Activity/Legal Involvement Pertinent to Current Situation/Hospitalization:  No - Comment as needed Significant Relationships:  Siblings Lives with:  Facility Resident Do you feel safe going back to the place where you live?  Yes Need for family participation in patient care:  Yes (Comment)  Care giving concerns: Patient is a private pay resident at [facility].    Social Worker assessment / plan:  Visual merchandiserClinical Social Worker (CSW) reviewed chart and noted that patient is having surgery today for a hip fracture. CSW attempted to meet with patient however she was already in the OR. CSW contacted patient's brother/ HPOA Alfredo BachCecil [phone] to complete assessment. Per brother patient is a current resident at [facility] and he wants her to go [facility]. FL2 complete and faxed out.   CSW presented bed offers to patient's brother Alfredo BachCecil and he chose [facility]. [facility] aware of accepted bed offer. CSW will continue to follow and assist as needed.   Employment status:  Disabled (Comment on whether or not currently receiving Disability), Retired Database administratornsurance information:  Managed Medicare PT Recommendations:  Not assessed at this time Information /  Referral to community resources:  Skilled Nursing Facility  Patient/Family's Response to care:  Patient's brother is agreeable for patient to go to [facility].   Patient/Family's Understanding of and Emotional Response to Diagnosis, Current Treatment, and Prognosis:  Patient's brother was very pleasant and thanked CSW for assistance.   Emotional Assessment Appearance:  Appears stated age Attitude/Demeanor/Rapport:  Unable to Assess Affect (typically observed):  Unable to Assess Orientation:  Oriented to Self, Oriented to Place, Fluctuating Orientation (Suspected and/or reported Sundowners) Alcohol / Substance use:  Not Applicable Psych involvement (Current and /or in the community):  No (Comment)  Discharge Needs  Concerns to be addressed:  Discharge Planning Concerns Readmission within the last 30 days:  No Current discharge risk:  Dependent with Mobility Barriers to Discharge:  Continued Medical Work up   [employer], Darleen CrockerBailey M, LCSW 02/07/2017, 4:18 PM

## 2017-02-07 NOTE — Anesthesia Postprocedure Evaluation (Signed)
Anesthesia Post Note  Patient: Donna Goodman  Procedure(s) Performed: CANNULATED HIP PINNING (Right )  Patient location during evaluation: PACU Anesthesia Type: Spinal Level of consciousness: oriented and awake and alert Pain management: pain level controlled Vital Signs Assessment: post-procedure vital signs reviewed and stable Respiratory status: spontaneous breathing, respiratory function stable and patient connected to nasal cannula oxygen Cardiovascular status: blood pressure returned to baseline and stable Postop Assessment: no headache, no backache and no apparent nausea or vomiting Anesthetic complications: no     Last Vitals:  Vitals:   02/07/17 0919 02/07/17 0928  BP: (!) 118/56 (!) 123/52  Pulse: 63 63  Resp: 16 16  Temp:    SpO2: 100% 100%    Last Pain:  Vitals:   02/06/17 1953  TempSrc: Oral  PainSc:                  Wealthy Danielski S

## 2017-02-07 NOTE — Op Note (Signed)
02/07/2017  8:54 AM  PATIENT:  Donna Goodman    PRE-OPERATIVE DIAGNOSIS:  right hip fracture  POST-OPERATIVE DIAGNOSIS:  Same  PROCEDURE:  CANNULATED HIP PINNING, RIGHT  SURGEON:  Lyndle HerrlichJames R Glena Pharris, MD   ANESTHESIA:   General  PREOPERATIVE INDICATIONS:  Donna Goodman is a  83 y.o. female who fell and was found to have a diagnosis of right hip fracture who elected for surgical management.    The risks benefits and alternatives were discussed with the patient preoperatively including but not limited to the risks of infection, bleeding, nerve injury, cardiopulmonary complications, blood clots, malunion, nonunion, avascular necrosis, the need for revision surgery, the potential for conversion to hemiarthroplasty, among others, and the patient was willing to proceed.  OPERATIVE IMPLANTS: 6.5 mm cannulated screws x3  OPERATIVE FINDINGS: Clinical osteoporosis with weak bone, proximal femur  OPERATIVE PROCEDURE: The patient was brought to the operating room and placed in supine position. IV antibiotics were given. General anesthesia administered.  The patient was placed on the fracture table. The operative extremity was positioned, without any significant reduction maneuver and was prepped and draped in usual sterile fashion.  Time out was performed.  Small incision was made distal to the greater trochanter, and 3 guidewires were introduced into the head and neck. The lengths were measured. The reduction was slightly valgus, and near-anatomic. I opened the cortex with a cannulated drill, and then placed the screws into position. Satisfactory fixation was achieved.  The wounds were irrigated copiously, and repaired with 0 Vicryl for the deep fascia and 2-0 Vicryl and staples for the skin. A sterile dressing was applied. Sponge and needle count were correct.  There no apparent complications and the patient tolerated the procedure well.  Dola ArgyleJames R. Odis LusterBowers, MD

## 2017-02-07 NOTE — Progress Notes (Signed)
[employer] - [place] [facility]   PATIENT NAME: Donna Goodman    MR#:  [id number]  DATE OF BIRTH:  Dec 10, 1927  SUBJECTIVE:  Patient seen in PACU. Did well postop  REVIEW OF SYSTEMS:    Due to dementia unable to obtain    Tolerating Diet: yes      DRUG ALLERGIES:  No Known Allergies  VITALS:  Blood pressure 100/86, pulse 73, temperature 97.8 F (36.6 C), temperature source Axillary, resp. rate 18, height 5\' 4"  (1.626 m), weight 54.9 kg (121 lb 1.6 oz), SpO2 98 %.  PHYSICAL EXAMINATION:  Constitutional: Appears well-developed and well-nourished. No distress. HENT: Normocephalic. Marland Kitchen. Oropharynx is clear and moist.  Eyes: Conjunctivae and EOM are normal. PERRLA, no scleral icterus.  Neck: Normal ROM. Neck supple. No JVD. No tracheal deviation. CVS: RRR, S1/S2 +, no murmurs, no gallops, no carotid bruit.  Pulmonary: Effort and breath sounds normal, no stridor, rhonchi, wheezes, rales.  Abdominal: Soft. BS +,  no distension, tenderness, rebound or guarding.  Musculoskeletal: Normal range of motion. No edema and no tenderness.  Sterile dressing placed right hip Neuro: Alert. CN 2-12 grossly intact. No focal deficits. Skin: Skin is warm and dry. No rash noted. Psychiatric: dementia    LABORATORY PANEL:   CBC Recent Labs  Lab 02/06/17 0608  WBC 11.3*  HGB 12.6  HCT 38.4  PLT 155   ------------------------------------------------------------------------------------------------------------------  Chemistries  Recent Labs  Lab 02/06/17 0608  NA 142  K 3.6  CL 108  CO2 27  GLUCOSE 107*  BUN 23*  CREATININE 0.76  CALCIUM 9.3   ------------------------------------------------------------------------------------------------------------------  Cardiac Enzymes No results for input(s): TROPONINI in the last 168 hours. ------------------------------------------------------------------------------------------------------------------  RADIOLOGY:   Dg Shoulder Right  Result Date: 02/06/2017 CLINICAL DATA:  Unwitnessed fall in the shower, right shoulder, elbow and hip pain. EXAM: RIGHT SHOULDER - 2+ VIEW COMPARISON:  None. FINDINGS: No acute fracture or dislocation. Glenohumeral osteoarthritis, at least moderate in degree. Mild acromioclavicular degenerative change. IMPRESSION: Degenerative change of the right shoulder without acute fracture or subluxation. Electronically Signed   By: Rubye OaksMelanie  Ehinger M.D.   On: 02/06/2017 05:19   Dg Elbow Complete Right  Result Date: 02/06/2017 CLINICAL DATA:  Unwitnessed fall in the shower, right shoulder, elbow and hip pain. EXAM: RIGHT ELBOW - COMPLETE 3+ VIEW COMPARISON:  None. FINDINGS: There is no evidence of fracture, dislocation, or joint effusion. There is no evidence of arthropathy or other focal bone abnormality. Focal soft tissue prominence overlies the olecranon. IMPRESSION: 1. No fracture, dislocation, or joint effusion. 2. Focal soft tissue prominence overlies the olecranon, likely hematoma in the setting of injury. Alternatively, olecranon bursitis could have a similar appearance, recommend correlation for acuity. Electronically Signed   By: Rubye OaksMelanie  Ehinger M.D.   On: 02/06/2017 05:21   Dg Chest Port 1 View  Result Date: 02/06/2017 CLINICAL DATA:  Fall, right hip fracture. EXAM: PORTABLE CHEST 1 VIEW COMPARISON:  Radiograph 05/25/2016 FINDINGS: The cardiomediastinal contours are normal. Previous streaky left lung base opacity has resolved. Pulmonary vasculature is normal. No consolidation, pleural effusion, or pneumothorax. No acute osseous abnormalities are seen. Chronic change of the right shoulder. IMPRESSION: No acute abnormality. Electronically Signed   By: Rubye OaksMelanie  Ehinger M.D.   On: 02/06/2017 05:59   Dg Hip Operative Unilat With Pelvis Right  Result Date: 02/07/2017 CLINICAL DATA:  Postop right hip surgery EXAM: OPERATIVE RIGHT HIP (WITH PELVIS IF PERFORMED) 2 VIEWS TECHNIQUE:  Fluoroscopic spot image(s) were submitted for  interpretation post-operatively. COMPARISON:  02/06/2017 FINDINGS: Placement of 3 screws across the right femoral neck fracture. Near anatomic alignment. No hardware complicating feature. IMPRESSION: Internal fixation as above.  No visible complicating feature. Electronically Signed   By: Charlett NoseKevin  Dover M.D.   On: 02/07/2017 08:48   Dg Hip Unilat With Pelvis 2-3 Views Right  Result Date: 02/06/2017 CLINICAL DATA:  Unwitnessed fall in the shower, right shoulder, elbow and hip pain. EXAM: DG HIP (WITH OR WITHOUT PELVIS) 2-3V RIGHT COMPARISON:  Radiographs 01/21/2015 FINDINGS: Impacted subcapital right femoral neck fracture with mild displacement. Femoral head remains seated. Pubic rami are intact. No additional fracture of the pelvis. The bones are under mineralized. IMPRESSION: Impacted subcapital right femoral neck fracture. Electronically Signed   By: Rubye OaksMelanie  Ehinger M.D.   On: 02/06/2017 05:20     ASSESSMENT AND PLAN:   83 year old female with a history of COPD and dementia that is post mechanical fall and found to have right hip fracture.  1. Right hip fracture: Patient is postoperative day #0 Management as per orthopedic surgery  2. Dementia/depression: Continue Aricept, Depakote, Zoloft  3. Essential hypertension: Continue metoprolol  4. Hypothyroidism: Continue Synthroid  5. Hyperlipidemia: Continue statin  Management plans discussed with nursing  CODE STATUS: DNR  TOTAL TIME TAKING CARE OF THIS PATIENT: 26 minutes.     POSSIBLE D/C 1-2 days, DEPENDING ON CLINICAL CONDITION.   Idil Maslanka M.D on 02/07/2017 at 10:26 AM  Between 7am to 6pm - Pager - [phone] After 6pm go to [email or web address] - password [email or web address]  [facility]  Office  [phone]  CC: Primary care physician; System, Pcp Not In  Note: This dictation was prepared with Dragon dictation along with smaller phrase technology. Any transcriptional  errors that result from this process are unintentional.

## 2017-02-07 NOTE — NC FL2 (Addendum)
[place] MEDICAID FL2 LEVEL OF CARE SCREENING TOOL     IDENTIFICATION  Patient Name: Donna Goodman Birthdate: [birth year] Sex: female Admission Date (Current Location): 02/06/2017  [place]   Facility and Address:  [facility], [address], [place], [place] [address]      Provider Number: [id number]  Attending Physician Name and Address:  Adrian SaranMody, Sital, MD  Relative Name and Phone Number:       Current Level of Care: Hospital Recommended Level of Care: Skilled Nursing Facility Prior Approval Number:    Date Approved/Denied:   PASRR Number:   [id number]   Discharge Plan: SNF    Current Diagnoses: Patient Active Problem List   Diagnosis Date Noted  . Hip fracture (HCC) 02/06/2017  . Multifocal pneumonia 01/29/2016  . SOB (shortness of breath) 01/29/2016  . Cough, persistent 01/29/2016  . COPD with acute exacerbation (HCC) 01/29/2016    Orientation RESPIRATION BLADDER Height & Weight     Self  Normal Continent Weight: 121 lb 1.6 oz (54.9 kg) Height:  5\' 4"  (162.6 cm)  BEHAVIORAL SYMPTOMS/MOOD NEUROLOGICAL BOWEL NUTRITION STATUS      Continent Diet(Diet: Clear Liquid to be advanced. )  AMBULATORY STATUS COMMUNICATION OF NEEDS Skin   Extensive Assist Verbally Surgical wounds(Incision: Right Hip. )                       Personal Care Assistance Level of Assistance  Bathing, Feeding, Dressing Bathing Assistance: Limited assistance Feeding assistance: Independent Dressing Assistance: Limited assistance     Functional Limitations Info  Sight, Hearing, Speech Sight Info: Adequate Hearing Info: Impaired Speech Info: Adequate    SPECIAL CARE FACTORS FREQUENCY  PT (By licensed PT), OT (By licensed OT)     PT Frequency: (5) OT Frequency: (5)            Contractures      Additional Factors Info  Code Status, Allergies Code Status Info: (DNR ) Allergies Info: (No Known Allergies. )            Current Medications (02/07/2017):  This is the current hospital active medication list Current Facility-Administered Medications  Medication Dose Route Frequency Provider Last Rate Last Dose  . 0.9 %  sodium chloride infusion   Intravenous Continuous Arnaldo Nataliamond, Michael S, MD 100 mL/hr at 02/06/17 1950    . acetaminophen (TYLENOL) tablet 650 mg  650 mg Oral Q6H PRN Arnaldo Nataliamond, Michael S, MD       Or  . acetaminophen (TYLENOL) suppository 650 mg  650 mg Rectal Q6H PRN Arnaldo Nataliamond, Michael S, MD      . albuterol (PROVENTIL) (2.5 MG/3ML) 0.083% nebulizer solution 2.5 mg  2.5 mg Nebulization Q4H PRN Arnaldo Nataliamond, Michael S, MD      . alum & mag hydroxide-simeth (MAALOX/MYLANTA) 200-200-20 MG/5ML suspension 30 mL  30 mL Oral QID PRN Arnaldo Nataliamond, Michael S, MD      . atorvastatin (LIPITOR) tablet 20 mg  20 mg Oral QHS Arnaldo Nataliamond, Michael S, MD   20 mg at 02/06/17 2102  . calcium carbonate (TUMS - dosed in mg elemental calcium) chewable tablet 100 mg of elemental calcium  100 mg of elemental calcium Oral Daily Arnaldo Nataliamond, Michael S, MD   100 mg of elemental calcium at 02/06/17 1003  . ceFAZolin (ANCEF) 1 g in dextrose 5 % 50 mL IVPB  1 g Intravenous Q6H Mody, Sital, MD      . Chlorhexidine Gluconate Cloth 2 %  PADS 6 each  6 each Topical Q0600 Lyndle Herrlich, MD   6 each at 02/07/17 [phone]  . cholecalciferol (VITAMIN D) tablet 2,000 Units  2,000 Units Oral Daily Arnaldo Natal, MD   2,000 Units at 02/06/17 1003  . divalproex (DEPAKOTE) DR tablet 125 mg  125 mg Oral TID Arnaldo Natal, MD   125 mg at 02/06/17 2101  . docusate sodium (COLACE) capsule 100 mg  100 mg Oral BID Arnaldo Natal, MD   100 mg at 02/06/17 2101  . donepezil (ARICEPT) tablet 5 mg  5 mg Oral QHS Arnaldo Natal, MD   5 mg at 02/06/17 2102  . guaifenesin (ROBITUSSIN) 100 MG/5ML syrup 200 mg  200 mg Oral Q6H PRN Arnaldo Natal, MD      . HYDROcodone-acetaminophen (NORCO/VICODIN) 5-325 MG per tablet 1 tablet  1 tablet Oral Q4H PRN Lyndle Herrlich, MD      . HYDROcodone-acetaminophen (NORCO/VICODIN) 5-325 MG per tablet 2 tablet  2 tablet Oral Q4H PRN Lyndle Herrlich, MD      . lactated ringers infusion   Intravenous Continuous Lyndle Herrlich, MD      . levothyroxine (SYNTHROID, LEVOTHROID) tablet 100 mcg  100 mcg Oral QAC breakfast Arnaldo Natal, MD      . lidocaine-EPINEPHrine-tetracaine (LET) solution  3 mL Topical Once Irean Hong, MD      . loperamide (IMODIUM) capsule 2 mg  2 mg Oral PRN Arnaldo Natal, MD      . loratadine (CLARITIN) tablet 10 mg  10 mg Oral Daily Arnaldo Natal, MD   10 mg at 02/06/17 1004  . magnesium hydroxide (MILK OF MAGNESIA) suspension 30 mL  30 mL Oral QHS PRN Arnaldo Natal, MD      . metoCLOPramide (REGLAN) tablet 5-10 mg  5-10 mg Oral Q8H PRN Lyndle Herrlich, MD       Or  . metoCLOPramide (REGLAN) injection 5-10 mg  5-10 mg Intravenous Q8H PRN Lyndle Herrlich, MD      . metoprolol succinate (TOPROL-XL) 24 hr tablet 12.5 mg  12.5 mg Oral Daily Arnaldo Natal, MD   12.5 mg at 02/06/17 1004  . mometasone-formoterol (DULERA) 100-5 MCG/ACT inhaler 2 puff  2 puff Inhalation BID Arnaldo Natal, MD   2 puff at 02/06/17 1004  . morphine 2 MG/ML injection 2-4 mg  2-4 mg Intravenous Q4H PRN Arnaldo Natal, MD   2 mg at 02/06/17 1756  . multivitamin with minerals tablet 1 tablet  1 tablet Oral Daily Arnaldo Natal, MD   1 tablet at 02/06/17 1003  . neomycin-bacitracin-polymyxin (NEOSPORIN) ointment 1 application  1 application Topical PRN Arnaldo Natal, MD      . ondansetron [facility]) tablet 4 mg  4 mg Oral Q6H PRN Arnaldo Natal, MD       Or  . ondansetron [facility]) injection 4 mg  4 mg Intravenous Q6H PRN Arnaldo Natal, MD      . saccharomyces boulardii (FLORASTOR) capsule 250 mg  250 mg Oral Daily Arnaldo Natal, MD   250 mg at 02/06/17 1003  . sertraline (ZOLOFT) tablet 50 mg  50 mg Oral Daily Arnaldo Natal, MD   50 mg at 02/06/17 1003  . topiramate  (TOPAMAX) tablet 25 mg  25 mg Oral QHS Arnaldo Natal, MD   25 mg at 02/06/17 2101     Discharge Medications: Please see discharge  summary for a list of discharge medications.  Relevant Imaging Results:  Relevant Lab Results:   Additional Information (SSN: [id number])  Lova Urbieta, Darleen CrockerBailey M, LCSW

## 2017-02-07 NOTE — Care Management (Signed)
Patient active with [facility]

## 2017-02-07 NOTE — Anesthesia Post-op Follow-up Note (Signed)
Anesthesia QCDR form completed.        

## 2017-02-07 NOTE — H&P (Signed)
The patient has been re-examined, and the chart reviewed, and there have been no interval changes to the documented history and physical.  Plan a right hip closed reduction and pinning today.  Anesthesia is not consulted regarding a peripheral nerve block for post-operative pain.  The risks, benefits, and alternatives have been discussed at length, and the patient is willing to proceed.

## 2017-02-07 NOTE — Clinical Social Work Placement (Signed)
   CLINICAL SOCIAL WORK PLACEMENT  NOTE  Date:  02/07/2017  Patient Details  Name: Donna Goodman MRN: [id number] Date of Birth: [birth year]  Clinical Social Work is seeking post-discharge placement for this patient at the Skilled  Nursing Facility level of care (*CSW will initial, date and re-position this form in  chart as items are completed):  Yes   Patient/family provided with [facility] Clinical Social Work Department's list of facilities offering this level of care within the geographic area requested by the patient (or if unable, by the patient's family).  Yes   Patient/family informed of their freedom to choose among providers that offer the needed level of care, that participate in Medicare, Medicaid or managed care program needed by the patient, have an available bed and are willing to accept the patient.  Yes   Patient/family informed of [facility]'s ownership interest in [facility] and [facility], as well as of the fact that they are under no obligation to receive care at these facilities.  PASRR submitted to EDS on 02/07/17     PASRR number received on 02/07/17     Existing PASRR number confirmed on       FL2 transmitted to all facilities in geographic area requested by pt/family on 02/07/17     FL2 transmitted to all facilities within larger geographic area on       Patient informed that his/her managed care company has contracts with or will negotiate with certain facilities, including the following:        Yes   Patient/family informed of bed offers received.  Patient chooses bed at [facility]     Physician recommends and patient chooses bed at      Patient to be transferred to   on  .  Patient to be transferred to facility by       Patient family notified on   of transfer.  Name of family member notified:        PHYSICIAN       Additional Comment:    _______________________________________________ Zonia Caplin, Darleen CrockerBailey M,  LCSW 02/07/2017, 4:14 PM

## 2017-02-07 NOTE — Transfer of Care (Signed)
Immediate Anesthesia Transfer of Care Note  Patient: Donna Goodman  Procedure(s) Performed: CANNULATED HIP PINNING (Right )  Patient Location: PACU  Anesthesia Type:Spinal  Level of Consciousness: awake and alert   Airway & Oxygen Therapy: Patient Spontanous Breathing and Patient connected to nasal cannula oxygen  Post-op Assessment: Report given to RN and Post -op Vital signs reviewed and stable  Post vital signs: Reviewed and stable  Last Vitals:  Vitals:   02/06/17 1953 02/07/17 0849  BP: (!) 156/73 119/87  Pulse: 72 67  Resp: 19 18  Temp: 36.9 C   SpO2: 92% 100%    Last Pain:  Vitals:   02/06/17 1953  TempSrc: Oral  PainSc:          Complications: No apparent anesthesia complications

## 2017-02-08 ENCOUNTER — Inpatient Hospital Stay: Payer: Medicare Other

## 2017-02-08 ENCOUNTER — Encounter
Admission: RE | Admit: 2017-02-08 | Discharge: 2017-02-08 | Disposition: A | Discharge status: Home / Self Care | Payer: Medicare Other | Source: Ambulatory Visit | Attending: Internal Medicine | Admitting: Internal Medicine

## 2017-02-08 LAB — COMPREHENSIVE METABOLIC PANEL
ALBUMIN: 3.1 g/dL — AB (ref 3.5–5.0)
ALK PHOS: 48 U/L (ref 38–126)
ALT: 12 U/L — AB (ref 14–54)
ANION GAP: 6 (ref 5–15)
AST: 23 U/L (ref 15–41)
BUN: 12 mg/dL (ref 6–20)
CALCIUM: 8.3 mg/dL — AB (ref 8.9–10.3)
CO2: 25 mmol/L (ref 22–32)
Chloride: 108 mmol/L (ref 101–111)
Creatinine, Ser: 0.52 mg/dL (ref 0.44–1.00)
GFR calc Af Amer: >60 mL/min (ref >60)
GFR calc non Af Amer: >60 mL/min (ref >60)
GLUCOSE: 121 mg/dL — AB (ref 65–99)
Potassium: 2.7 mmol/L — CL (ref 3.5–5.1)
SODIUM: 139 mmol/L (ref 135–145)
Total Bilirubin: 0.6 mg/dL (ref 0.3–1.2)
Total Protein: 5.5 g/dL — ABNORMAL LOW (ref 6.5–8.1)

## 2017-02-08 LAB — CBC
HEMATOCRIT: 33.4 % — AB (ref 35.0–47.0)
HEMOGLOBIN: 11 g/dL — AB (ref 12.0–16.0)
MCH: 31.8 pg (ref 26.0–34.0)
MCHC: 32.9 g/dL (ref 32.0–36.0)
MCV: 96.6 fL (ref 80.0–100.0)
Platelets: 113 10*3/uL — ABNORMAL LOW (ref 150–440)
RBC: 3.46 MIL/uL — ABNORMAL LOW (ref 3.80–5.20)
RDW: 12.6 % (ref 11.5–14.5)
WBC: 9.4 10*3/uL (ref 3.6–11.0)

## 2017-02-08 LAB — POTASSIUM: POTASSIUM: 3.7 mmol/L (ref 3.5–5.1)

## 2017-02-08 LAB — MAGNESIUM: Magnesium: 1.7 mg/dL (ref 1.7–2.4)

## 2017-02-08 MED ORDER — DOCUSATE SODIUM 50 MG/5ML PO LIQD
100.0000 mg | Freq: Two times a day (BID) | ORAL | Status: DC
  Administered 2017-02-08 (×2): 100 mg via ORAL
  Filled 2017-02-08 (×6): qty 10

## 2017-02-08 MED ORDER — POTASSIUM CHLORIDE 10 MEQ/100ML IV SOLN
10.0000 meq | Freq: Once | INTRAVENOUS | Status: AC
  Administered 2017-02-08: 10 meq via INTRAVENOUS
  Filled 2017-02-08: qty 100

## 2017-02-08 MED ORDER — POTASSIUM CHLORIDE CRYS ER 20 MEQ PO TBCR: 40.0000 meq | EXTENDED_RELEASE_TABLET | Freq: Once | ORAL | Status: DC

## 2017-02-08 MED ORDER — POTASSIUM CHLORIDE CRYS ER 20 MEQ PO TBCR
40.0000 meq | EXTENDED_RELEASE_TABLET | ORAL | Status: AC
  Administered 2017-02-08 (×2): 40 meq via ORAL
  Filled 2017-02-08 (×2): qty 2

## 2017-02-08 MED ORDER — POTASSIUM CHLORIDE CRYS ER 20 MEQ PO TBCR: 40.0000 meq | EXTENDED_RELEASE_TABLET | Freq: Two times a day (BID) | ORAL | Status: DC

## 2017-02-08 MED ORDER — VALPROATE SODIUM 250 MG/5ML PO SOLN
125.0000 mg | Freq: Three times a day (TID) | ORAL | Status: DC
  Administered 2017-02-08 – 2017-02-09 (×3): 125 mg via ORAL
  Filled 2017-02-08 (×6): qty 2.5

## 2017-02-08 MED ORDER — MAGNESIUM SULFATE 2 GM/50ML IV SOLN
2.0000 g | Freq: Once | INTRAVENOUS | Status: AC
  Administered 2017-02-08: 2 g via INTRAVENOUS
  Filled 2017-02-08: qty 50

## 2017-02-08 MED ORDER — CELECOXIB 200 MG PO CAPS
200.0000 mg | ORAL_CAPSULE | Freq: Two times a day (BID) | ORAL | Status: DC
  Administered 2017-02-08 – 2017-02-09 (×3): 200 mg via ORAL
  Filled 2017-02-08 (×3): qty 1

## 2017-02-08 MED ORDER — TRAMADOL HCL 50 MG PO TABS
50.0000 mg | ORAL_TABLET | Freq: Four times a day (QID) | ORAL | Status: DC | PRN
  Administered 2017-02-09: 50 mg via ORAL
  Filled 2017-02-08: qty 1

## 2017-02-08 MED ORDER — ACETAMINOPHEN 500 MG PO TABS
1000.0000 mg | ORAL_TABLET | Freq: Three times a day (TID) | ORAL | Status: DC
  Administered 2017-02-08 – 2017-02-09 (×4): 1000 mg via ORAL
  Filled 2017-02-08 (×4): qty 2

## 2017-02-08 NOTE — Evaluation (Signed)
Physical Therapy Evaluation Patient Details Name: Donna Goodman MRN: [id number] DOB: [birth year] Today's Date: 02/08/2017   History of Present Illness  presented to ER secondary to fall with R hip pain; admitted with impacted R femoral neck fractuer, status post cannulated hip pinning (02/07/17); WBAT.  Clinical Impression  Upon evaluation, patient alert and oriented to self only; follows simple commands with hand-over-hand assist to initiate movement.  Very guarded with R hip movement, limited by pain, but participates appropriately with increased encouragement and frequent redirection to task.  Currently requiring mod/max assist +2 for bed mobility; min assist for unsupported sitting balance; mod/max assist +2 for lateral/scoot pivot transfers over level surfaces (due to difficulty understanding new movement patterns). Unsafe/unable to attempt gait efforts at this time; will plan to attempt standing this PM as appropriate. Would benefit from skilled PT to address above deficits and promote optimal return to PLOF; recommend transition to STR upon discharge from acute hospitalization.     Follow Up Recommendations SNF    Equipment Recommendations  Rolling walker with 5" wheels    Recommendations for Other Services       Precautions / Restrictions Precautions Precautions: Fall Restrictions Weight Bearing Restrictions: Yes RLE Weight Bearing: Weight bearing as tolerated      Mobility  Bed Mobility Overal bed mobility: Needs Assistance Bed Mobility: Supine to Sit     Supine to sit: Max assist;Mod assist;+2 for physical assistance        Transfers Overall transfer level: Needs assistance     Sit to Stand: Mod assist;+2 physical assistance         General transfer comment: lateral scoot pivot over level surfaces, constant cuing/assist for hand placement, weight shift, lfit off/lateral movement; tends to try to attempt stand vs scoot (unable to fully comprehend new  movement pattern  Ambulation/Gait             General Gait Details: unsafe/unable  Stairs            Wheelchair Mobility    Modified Rankin (Stroke Patients Only)       Balance Overall balance assessment: Needs assistance Sitting-balance support: No upper extremity supported;Feet supported Sitting balance-Leahy Scale: Fair       Standing balance-Leahy Scale: Poor                               Pertinent Vitals/Pain Pain Assessment: Faces Faces Pain Scale: Hurts whole lot Pain Location: R LE Pain Descriptors / Indicators: Aching;Grimacing;Guarding Pain Intervention(s): Limited activity within patient's tolerance;Monitored during session;Repositioned;Premedicated before session    Home Living Family/patient expects to be discharged to:: Skilled nursing facility                      Prior Function Level of Independence: Needs assistance         Comments: At baseline, patient resident of [facility]; ambulatory with [phone] for basic transfers and household distances (per brother).  Assist from staff as needed for toileting, ADLs.     Hand Dominance        Extremity/Trunk Assessment   Upper Extremity Assessment Upper Extremity Assessment: Overall WFL for tasks assessed    Lower Extremity Assessment Lower Extremity Assessment: Generalized weakness(R hip grossly 3-/5 throughout, R knee and ankle 4-/5; L LE grossly 4-/5 throughout)       Communication   Communication: No difficulties  Cognition Arousal/Alertness: Awake/alert Behavior During Therapy: WFL for tasks  assessed/performed Overall Cognitive Status: History of cognitive impairments - at baseline                                 General Comments: oriented to self only; follows simple commands with hand-over-hand assist to initiate movement      General Comments      Exercises Other Exercises Other Exercises: Supine LE therex, 1x5, act assist ROM for  strength/flexibility throughout R LE.  Constant cuing/reorientation to situation and role of therapy.   Assessment/Plan    PT Assessment Patient needs continued PT services  PT Problem List Decreased strength;Decreased range of motion;Decreased activity tolerance;Decreased balance;Decreased mobility;Decreased coordination;Decreased cognition;Decreased knowledge of use of DME;Decreased safety awareness;Decreased knowledge of precautions;Cardiopulmonary status limiting activity;Decreased skin integrity;Pain       PT Treatment Interventions DME instruction;Gait training;Functional mobility training;Therapeutic activities;Therapeutic exercise;Balance training;Neuromuscular re-education;Cognitive remediation;Patient/family education    PT Goals (Current goals can be found in the Care Plan section)  Acute Rehab PT Goals PT Goal Formulation: Patient unable to participate in goal setting Time For Goal Achievement: 02/22/17 Potential to Achieve Goals: Fair    Frequency BID   Barriers to discharge Decreased caregiver support      Co-evaluation               AM-PAC PT "6 Clicks" Daily Activity  Outcome Measure Difficulty turning over in bed (including adjusting bedclothes, sheets and blankets)?: Unable Difficulty moving from lying on back to sitting on the side of the bed? : Unable Difficulty sitting down on and standing up from a chair with arms (e.g., wheelchair, bedside commode, etc,.)?: Unable Help needed moving to and from a bed to chair (including a wheelchair)?: A Lot Help needed walking in hospital room?: A Lot Help needed climbing 3-5 steps with a railing? : Total 6 Click Score: 8    End of Session Equipment Utilized During Treatment: Gait belt Activity Tolerance: Patient limited by pain Patient left: in chair;with call bell/phone within reach;with chair alarm set Nurse Communication: Mobility status PT Visit Diagnosis: Difficulty in walking, not elsewhere classified  (R26.2);Muscle weakness (generalized) (M62.81);Pain Pain - Right/Left: Right Pain - part of body: Hip    Time: [id number] PT Time Calculation (min) (ACUTE ONLY): 30 min   Charges:   PT Evaluation $PT Eval Moderate Complexity: 1 Mod PT Treatments $Therapeutic Exercise: 8-22 mins   PT G Codes:   PT G-Codes **NOT FOR INPATIENT CLASS** Functional Assessment Tool Used: AM-PAC 6 Clicks Basic Mobility Functional Limitation: Mobility: Walking and moving around Mobility: Walking and Moving Around Current Status ([id number]): At least 80 percent but less than 100 percent impaired, limited or restricted Mobility: Walking and Moving Around Goal Status [phone]): At least 20 percent but less than 40 percent impaired, limited or restricted    Tanicia Wolaver H. Manson PasseyBrown, PT, DPT, NCS 02/08/17, 3:33 PM [phone]

## 2017-02-08 NOTE — Progress Notes (Signed)
Plan is for patient to D[facility]C to [facility] tomorrow pending medical clearance. [facility] patient can come to room 203-B. Clinical Child psychotherapistocial Worker (CSW) contacted patient's brother Alfredo BachCecil and made him aware of above. CSW sent D/C summary to [facility] today via H[facility].   [employer]iley Haneen Bernales, LCSW [phone]

## 2017-02-08 NOTE — Discharge Summary (Signed)
[facility] [place] [facility]   PATIENT NAME: Donna Goodman    MR#:  [id number]  DATE OF BIRTH:  [birth year]  DATE OF ADMISSION:  02/06/2017 ADMITTING PHYSICIAN: Arnaldo NatalMichael S Diamond, MD  DATE OF DISCHARGE: 02/09/2017  PRIMARY CARE PHYSICIAN: System, Pcp Not In    ADMISSION DIAGNOSIS:  Closed fracture of neck of right femur, initial encounter (HCC) [S72.001A] Fall, initial encounter [W19.XXXA]  DISCHARGE DIAGNOSIS:  Active Problems:   Hip fracture (HCC)   SECONDARY DIAGNOSIS:   Past Medical History:  Diagnosis Date  . Asthma   . COPD (chronic obstructive pulmonary disease) (HCC)   . Dementia   . Hyperlipemia   . Hypertension     HOSPITAL COURSE:    83 year old female with a history of COPD and dementia that is post mechanical fall and found to have right hip fracture.  1. Right hip fracture: Patient is postoperative day #1 Management as per orthopedic surgery Plan for skilled nursing facility discharge tomorrow  2. Dementia/depression: Continue Aricept, Depakote, Zoloft  3. Essential hypertension: Continue metoprolol  4. Hypothyroidism: Continue Synthroid  5. Hyperlipidemia: Continue statin  6. Acute hypoxic respiratory failure: Check chest x-ray Wean oxygen as tolerated  7. Hypokalemia: This will be repleted. Magnesium should be checked. Pharmacy consultation for replacement    DISCHARGE CONDITIONS AND DIET:   Stable Regular diet  CONSULTS OBTAINED:    DRUG ALLERGIES:  No Known Allergies  DISCHARGE MEDICATIONS:   Allergies as of 02/08/2017   No Known Allergies     Medication List    TAKE these medications   acetaminophen 500 MG tablet Commonly known as:  TYLENOL Take 500 mg by mouth 3 (three) times daily.   acetaminophen 500 MG tablet Commonly known as:  TYLENOL Take 500 mg by mouth every 8 (eight) hours as needed for mild pain, fever or headache.   albuterol 108 (90 Base) MCG/ACT inhaler Commonly known  as:  PROVENTIL HFA;VENTOLIN HFA Inhale 2 puffs into the lungs every 6 (six) hours as needed for wheezing or shortness of breath.   alum & mag hydroxide-simeth 200-200-20 MG/5ML suspension Commonly known as:  MAALOX/MYLANTA Take 30 mLs by mouth 4 (four) times daily as needed for indigestion or heartburn.   atorvastatin 20 MG tablet Commonly known as:  LIPITOR Take 20 mg by mouth at bedtime.   calcium citrate 950 MG tablet Commonly known as:  CALCITRATE - dosed in mg elemental calcium Take 100 mg of elemental calcium by mouth daily.   cetirizine 10 MG tablet Commonly known as:  ZYRTEC Take 10 mg by mouth daily.   diclofenac sodium 1 % Gel Commonly known as:  VOLTAREN Apply 2 g topically 2 (two) times daily. To right hip for pain   divalproex 125 MG DR tablet Commonly known as:  DEPAKOTE Take 125 mg by mouth 3 (three) times daily.   donepezil 5 MG tablet Commonly known as:  ARICEPT Take 5 mg by mouth at bedtime.   Fluticasone-Salmeterol 100-50 MCG/DOSE Aepb Commonly known as:  ADVAIR Inhale 1 puff into the lungs 2 (two) times daily.   guaifenesin 100 MG/5ML syrup Commonly known as:  ROBITUSSIN Take 200 mg by mouth every 6 (six) hours as needed for cough.   ibuprofen 400 MG tablet Commonly known as:  ADVIL,MOTRIN Take 400 mg by mouth 3 (three) times daily.   levothyroxine 100 MCG tablet Commonly known as:  SYNTHROID, LEVOTHROID Take 100 mcg by mouth daily.   loperamide 2 MG capsule Commonly known  as:  IMODIUM Take 2 mg by mouth as needed for diarrhea or loose stools.   magnesium hydroxide 400 MG/5ML suspension Commonly known as:  MILK OF MAGNESIA Take 30 mLs by mouth at bedtime as needed for mild constipation.   metoprolol succinate 25 MG 24 hr tablet Commonly known as:  TOPROL-XL Take 12.5 mg by mouth daily.   multivitamin with minerals Tabs tablet Take 1 tablet by mouth daily.   MUSCLE RUB 10-15 % Crea Apply 1 application topically 3 (three) times daily as  needed (arm pain).   neomycin-bacitracin-polymyxin [phone] ointment Apply 1 application topically as needed (minor skin tears or abrasion).   saccharomyces boulardii 250 MG capsule Commonly known as:  FLORASTOR Take 250 mg by mouth daily.   sertraline 50 MG tablet Commonly known as:  ZOLOFT Take 50 mg by mouth daily.   topiramate 25 MG tablet Commonly known as:  TOPAMAX Take 25 mg by mouth at bedtime.   Vitamin D 2000 units Caps Take 2,000 Units by mouth daily.         Today   CHIEF COMPLAINT:   Patient with dementia   VITAL SIGNS:  Blood pressure 136/72, pulse 79, temperature 98.5 F (36.9 C), temperature source Axillary, resp. rate 20, height 5\' 4"  (1.626 m), weight 57.6 kg (127 lb), SpO2 99 %.   REVIEW OF SYSTEMS:  Review of Systems  Unable to perform ROS: Dementia     PHYSICAL EXAMINATION:  GENERAL:  83 y.o.-year-old patient lying in the bed with no acute distress.  NECK:  Supple, no jugular venous distention. No thyroid enlargement, no tenderness.  LUNGS: Normal breath sounds bilaterally, no wheezing, rales,rhonchi  No use of accessory muscles of respiration.  CARDIOVASCULAR: S1, S2 normal. No murmurs, rubs, or gallops.  ABDOMEN: Soft, non-tender, non-distended. Bowel sounds present. No organomegaly or mass.  EXTREMITIES: No pedal edema, cyanosis, or clubbing.  PSYCHIATRIC: The patient is alert and oriented x name only.  SKIN: No obvious rash, lesion, or ulcer.   DATA REVIEW:   CBC Recent Labs  Lab 02/08/17 0429  WBC 9.4  HGB 11.0*  HCT 33.4*  PLT 113*    Chemistries  Recent Labs  Lab 02/08/17 0429  NA 139  K 2.7*  CL 108  CO2 25  GLUCOSE 121*  BUN 12  CREATININE 0.52  CALCIUM 8.3*  AST 23  ALT 12*  ALKPHOS 48  BILITOT 0.6    Cardiac Enzymes No results for input(s): TROPONINI in the last 168 hours.  Microbiology Results  @MICRORSLT48 @  RADIOLOGY:  Dg Hip Operative Unilat With Pelvis Right  Result Date:  02/07/2017 CLINICAL DATA:  Postop right hip surgery EXAM: OPERATIVE RIGHT HIP (WITH PELVIS IF PERFORMED) 2 VIEWS TECHNIQUE: Fluoroscopic spot image(s) were submitted for interpretation post-operatively. COMPARISON:  02/06/2017 FINDINGS: Placement of 3 screws across the right femoral neck fracture. Near anatomic alignment. No hardware complicating feature. IMPRESSION: Internal fixation as above.  No visible complicating feature. Electronically Signed   By: Charlett Nose M.D.   On: 02/07/2017 08:48      Allergies as of 02/08/2017   No Known Allergies     Medication List    TAKE these medications   acetaminophen 500 MG tablet Commonly known as:  TYLENOL Take 500 mg by mouth 3 (three) times daily.   acetaminophen 500 MG tablet Commonly known as:  TYLENOL Take 500 mg by mouth every 8 (eight) hours as needed for mild pain, fever or headache.   albuterol 108 (90 Base)  MCG/ACT inhaler Commonly known as:  PROVENTIL HFA;VENTOLIN HFA Inhale 2 puffs into the lungs every 6 (six) hours as needed for wheezing or shortness of breath.   alum & mag hydroxide-simeth 200-200-20 MG/5ML suspension Commonly known as:  MAALOX/MYLANTA Take 30 mLs by mouth 4 (four) times daily as needed for indigestion or heartburn.   atorvastatin 20 MG tablet Commonly known as:  LIPITOR Take 20 mg by mouth at bedtime.   calcium citrate 950 MG tablet Commonly known as:  CALCITRATE - dosed in mg elemental calcium Take 100 mg of elemental calcium by mouth daily.   cetirizine 10 MG tablet Commonly known as:  ZYRTEC Take 10 mg by mouth daily.   diclofenac sodium 1 % Gel Commonly known as:  VOLTAREN Apply 2 g topically 2 (two) times daily. To right hip for pain   divalproex 125 MG DR tablet Commonly known as:  DEPAKOTE Take 125 mg by mouth 3 (three) times daily.   donepezil 5 MG tablet Commonly known as:  ARICEPT Take 5 mg by mouth at bedtime.   Fluticasone-Salmeterol 100-50 MCG/DOSE Aepb Commonly known as:   ADVAIR Inhale 1 puff into the lungs 2 (two) times daily.   guaifenesin 100 MG/5ML syrup Commonly known as:  ROBITUSSIN Take 200 mg by mouth every 6 (six) hours as needed for cough.   ibuprofen 400 MG tablet Commonly known as:  ADVIL,MOTRIN Take 400 mg by mouth 3 (three) times daily.   levothyroxine 100 MCG tablet Commonly known as:  SYNTHROID, LEVOTHROID Take 100 mcg by mouth daily.   loperamide 2 MG capsule Commonly known as:  IMODIUM Take 2 mg by mouth as needed for diarrhea or loose stools.   magnesium hydroxide 400 MG/5ML suspension Commonly known as:  MILK OF MAGNESIA Take 30 mLs by mouth at bedtime as needed for mild constipation.   metoprolol succinate 25 MG 24 hr tablet Commonly known as:  TOPROL-XL Take 12.5 mg by mouth daily.   multivitamin with minerals Tabs tablet Take 1 tablet by mouth daily.   MUSCLE RUB 10-15 % Crea Apply 1 application topically 3 (three) times daily as needed (arm pain).   neomycin-bacitracin-polymyxin [phone] ointment Apply 1 application topically as needed (minor skin tears or abrasion).   saccharomyces boulardii 250 MG capsule Commonly known as:  FLORASTOR Take 250 mg by mouth daily.   sertraline 50 MG tablet Commonly known as:  ZOLOFT Take 50 mg by mouth daily.   topiramate 25 MG tablet Commonly known as:  TOPAMAX Take 25 mg by mouth at bedtime.   Vitamin D 2000 units Caps Take 2,000 Units by mouth daily.         Management plans discussed with the patient's family and they are in agreement. Stable for discharge   Patient should follow up with ortho  CODE STATUS:     Code Status Orders  (From admission, onward)        Start     Ordered   02/06/17 0843  Do not attempt resuscitation (DNR)  Continuous    Question Answer Comment  In the event of cardiac or respiratory ARREST Do not call a "code blue"   In the event of cardiac or respiratory ARREST Do not perform Intubation, CPR, defibrillation or ACLS   In  the event of cardiac or respiratory ARREST Use medication by any route, position, wound care, and other measures to relive pain and suffering. May use oxygen, suction and manual treatment of airway obstruction as needed for comfort.  02/06/17 1610    Code Status History    Date Active Date Inactive Code Status Order ID Comments User Context   01/29/2016 17:35 02/01/2016 18:01 Full Code [id number]  Marguarite Arbour, MD Inpatient    Advance Directive Documentation     Most Recent Value  Type of Advance Directive  Healthcare Power of Attorney, Living will, Out of facility DNR (pink MOST or yellow form)  Pre-existing out of facility DNR order (yellow form or pink MOST form)  Yellow form placed in chart (order not valid for inpatient use)  "MOST" Form in Place?  No data      TOTAL TIME TAKING CARE OF THIS PATIENT: 38 minutes.    Note: This dictation was prepared with Dragon dictation along with smaller phrase technology. Any transcriptional errors that result from this process are unintentional.  Atasha Colebank M.D on 02/08/2017 at 11:38 AM  Between 7am to 6pm - Pager - [phone] After 6pm go to [email or web address] - password [email or web address]  [facility]  Office  [phone]  CC: Primary care physician; System, Pcp Not In

## 2017-02-08 NOTE — Progress Notes (Addendum)
Pharmacy Electrolyte Monitoring Consult:  Pharmacy consulted to assist in monitoring and replacing electrolytes in this 83 y.o. female admitted on 02/06/2017 with hip fracture s/p repair.   Labs:  Sodium (mmol/L)  Date Value  02/08/2017 139  04/08/2013 138   Potassium (mmol/L)  Date Value  02/08/2017 2.7 (LL)  04/08/2013 4.2   Magnesium (mg/dL)  Date Value  24/40/102710/28/2014 1.9   Calcium (mg/dL)  Date Value  25/36/644012/21/2018 8.3 (L)   Calcium, Total (mg/dL)  Date Value  34/74/259502/18/2015 8.6   Albumin (g/dL)  Date Value  63/87/564312/21/2018 3.1 (L)  04/07/2013 3.9    Plan: KCl 10 meq iv x 1 ordered per MD. Will order KCl 40 meq po q 2 hours x 2 doses and add-on a magnesium level to today's labs. Will recheck K at 1500.   Donna Goodman, Donna Goodman 02/08/2017 10:15 AM                      Addendum 12/21 @ 1200: Mg= 1.7. Will order magnesium sulfate 2 g iv once.

## 2017-02-08 NOTE — Progress Notes (Signed)
Pts. Potassium is 2.7. Dr. Tobi BastosPyreddy notified and he ordered potassium po.

## 2017-02-08 NOTE — Progress Notes (Signed)
[facility] - [place] [facility]   PATIENT NAME: Donna Goodman    MR#:  [id number]  DATE OF BIRTH:  [birth year]  SUBJECTIVE:  The family at bedside. Patient is confused this morning No acute events overnight  REVIEW OF SYSTEMS:    Due to dementia unable to obtain    Tolerating Diet: yes      DRUG ALLERGIES:  No Known Allergies  VITALS:  Blood pressure 136/72, pulse 79, temperature 98.5 F (36.9 C), temperature source Axillary, resp. rate 20, height 5\' 4"  (1.626 m), weight 57.6 kg (127 lb), SpO2 99 %.  PHYSICAL EXAMINATION:  Constitutional: Appears well-developed and well-nourished. No distress. HENT: Normocephalic. Marland Kitchen. Oropharynx is clear and moist.  Eyes: Conjunctivae and EOM are normal. PERRLA, no scleral icterus.  Neck: Normal ROM. Neck supple. No JVD. No tracheal deviation. CVS: RRR, S1/S2 +, no murmurs, no gallops, no carotid bruit.  Pulmonary: Normal effort without rales or rhonchi. Slight crackles heard at left base Abdominal: Soft. BS +,  no distension, tenderness, rebound or guarding.  Musculoskeletal: Normal range of motion. No edema and no tenderness.  Sterile dressing placed right hip Neuro: Alert. CN 2-12 grossly intact. No focal deficits. Skin: Skin is warm and dry. No rash noted. Psychiatric: dementia    LABORATORY PANEL:   CBC Recent Labs  Lab 02/08/17 0429  WBC 9.4  HGB 11.0*  HCT 33.4*  PLT 113*   ------------------------------------------------------------------------------------------------------------------  Chemistries  Recent Labs  Lab 02/08/17 0429  NA 139  K 2.7*  CL 108  CO2 25  GLUCOSE 121*  BUN 12  CREATININE 0.52  CALCIUM 8.3*  AST 23  ALT 12*  ALKPHOS 48  BILITOT 0.6   ------------------------------------------------------------------------------------------------------------------  Cardiac Enzymes No results for input(s): TROPONINI in the last 168  hours. ------------------------------------------------------------------------------------------------------------------  RADIOLOGY:  Dg Hip Operative Unilat With Pelvis Right  Result Date: 02/07/2017 CLINICAL DATA:  Postop right hip surgery EXAM: OPERATIVE RIGHT HIP (WITH PELVIS IF PERFORMED) 2 VIEWS TECHNIQUE: Fluoroscopic spot image(s) were submitted for interpretation post-operatively. COMPARISON:  02/06/2017 FINDINGS: Placement of 3 screws across the right femoral neck fracture. Near anatomic alignment. No hardware complicating feature. IMPRESSION: Internal fixation as above.  No visible complicating feature. Electronically Signed   By: Charlett NoseKevin  Dover M.D.   On: 02/07/2017 08:48     ASSESSMENT AND PLAN:   83 year old female with a history of COPD and dementia that is post mechanical fall and found to have right hip fracture.  1. Right hip fracture: Patient is postoperative day #1 Management as per orthopedic surgery Plan for skilled nursing facility discharge tomorrow  2. Dementia/depression: Continue Aricept, Depakote, Zoloft  3. Essential hypertension: Continue metoprolol  4. Hypothyroidism: Continue Synthroid  5. Hyperlipidemia: Continue statin  6. Acute hypoxic respiratory failure: Check chest x-ray Wean oxygen as tolerated  7. Hypokalemia: This will be repleted. Magnesium should be checked. Pharmacy consultation for replacement.  Management plans discussed with nursing  CODE STATUS: DNR  TOTAL TIME TAKING CARE OF THIS PATIENT: 26 minutes.   Plan of care discussed with family at bedside.  POSSIBLE D/C tomorrow DEPENDING ON CLINICAL CONDITION.   Donna Goodman M.D on 02/08/2017 at 11:34 AM  Between 7am to 6pm - Pager - [phone] After 6pm go to [email or web address] - password [email or web address]  [facility] [place] [facility]  Office  [phone]  CC: Primary care physician; System, Pcp Not In  Note: This dictation was prepared with Dragon dictation along with smaller  phrase technology. Any transcriptional errors that  result from this process are unintentional.

## 2017-02-08 NOTE — Progress Notes (Signed)
Physical Therapy Treatment Patient Details Name: Donna Goodman MRN: [id number] DOB: [birth year] Today's Date: 02/08/2017    History of Present Illness presented to ER secondary to fall with R hip pain; admitted with impacted R femoral neck fractuer, status post cannulated hip pinning (02/07/17); WBAT.    PT Comments    Able to initiate sit/stand and short-distance gait training (4-5 steps) with RW, mod assist +2 for safety.  Hand-over-hand for task initiation, but improved understanding and effort/performance with sit/stand and upright mobility (vs scoot pivot this AM).  Will plan to progress gait distance as able next session.    Follow Up Recommendations  SNF     Equipment Recommendations  Rolling walker with 5" wheels    Recommendations for Other Services       Precautions / Restrictions Precautions Precautions: Fall Restrictions Weight Bearing Restrictions: Yes RLE Weight Bearing: Weight bearing as tolerated    Mobility  Bed Mobility Overal bed mobility: Needs Assistance Bed Mobility: Sit to Supine     Supine to sit: +2 for physical assistance;Total assist     General bed mobility comments: total assist for LE management, pain control  Transfers Overall transfer level: Needs assistance Equipment used: Rolling walker (2 wheeled) Transfers: Sit to/from Stand Sit to Stand: Mod assist;+2 physical assistance         General transfer comment: cuing for hand placement, lift off; once standing, able to maitain static stance with +1 mod assist  Ambulation/Gait Ambulation/Gait assistance: Mod assist;+2 physical assistance Ambulation Distance (Feet): 4 Feet Assistive device: Rolling walker (2 wheeled)     Gait velocity interpretation: Below normal speed for age/gender General Gait Details: broad BOS with short, shuffling steps; very hesitant to advance L LE (due to fear/pain with R LE stance).  Poor standing balance/tolerance, requriing RW and +2 for optimal  safety.   Stairs            Wheelchair Mobility    Modified Rankin (Stroke Patients Only)       Balance Overall balance assessment: Needs assistance Sitting-balance support: No upper extremity supported;Feet supported Sitting balance-Leahy Scale: Fair     Standing balance support: Bilateral upper extremity supported Standing balance-Leahy Scale: Poor                              Cognition Arousal/Alertness: Awake/alert Behavior During Therapy: WFL for tasks assessed/performed Overall Cognitive Status: History of cognitive impairments - at baseline                                 General Comments: oriented to self only; follows simple commands with hand-over-hand assist to initiate movement      Exercises Other Exercises Other Exercises: Supine LE therex, 1x5, act assist ROM for strength/flexibility throughout R LE.  Constant cuing/reorientation to situation and role of therapy.    General Comments        Pertinent Vitals/Pain Pain Assessment: Faces Faces Pain Scale: Hurts whole lot Pain Location: R LE Pain Descriptors / Indicators: Aching;Grimacing;Guarding Pain Intervention(s): Limited activity within patient's tolerance;Monitored during session;Repositioned    Home Living Family/patient expects to be discharged to:: Skilled nursing facility                    Prior Function Level of Independence: Needs assistance      Comments: At baseline, patient resident of [address] [facility]; ambulatory with  [id number] for basic transfers and household distances (per brother).  Assist from staff as needed for toileting, ADLs.   PT Goals (current goals can now be found in the care plan section) Acute Rehab PT Goals PT Goal Formulation: Patient unable to participate in goal setting Time For Goal Achievement: 02/22/17 Potential to Achieve Goals: Fair Progress towards PT goals: Progressing toward goals    Frequency    BID      PT Plan  Current plan remains appropriate    Co-evaluation              AM-PAC PT "6 Clicks" Daily Activity  Outcome Measure  Difficulty turning over in bed (including adjusting bedclothes, sheets and blankets)?: Unable Difficulty moving from lying on back to sitting on the side of the bed? : Unable Difficulty sitting down on and standing up from a chair with arms (e.g., wheelchair, bedside commode, etc,.)?: Unable Help needed moving to and from a bed to chair (including a wheelchair)?: A Lot Help needed walking in hospital room?: A Lot Help needed climbing 3-5 steps with a railing? : Total 6 Click Score: 8    End of Session Equipment Utilized During Treatment: Gait belt Activity Tolerance: Patient tolerated treatment well Patient left: in bed;with call bell/phone within reach;with bed alarm set Nurse Communication: Mobility status PT Visit Diagnosis: Difficulty in walking, not elsewhere classified (R26.2);Muscle weakness (generalized) (M62.81);Pain Pain - Right/Left: Right Pain - part of body: Hip     Time: [id number] PT Time Calculation (min) (ACUTE ONLY): 11 min  Charges:  $Therapeutic Exercise: 8-22 mins $Therapeutic Activity: 8-22 mins                    G Codes:  Functional Assessment Tool Used: AM-PAC 6 Clicks Basic Mobility Functional Limitation: Mobility: Walking and moving around Mobility: Walking and Moving Around Current Status ([id number](G8978): At least 80 percent but less than 100 percent impaired, limited or restricted Mobility: Walking and Moving Around Goal Status [phone](G8979): At least 20 percent but less than 40 percent impaired, limited or restricted    Peace Jost H. Manson PasseyBrown, PT, DPT, NCS 02/08/17, 3:39 PM [phone]

## 2017-02-08 NOTE — Progress Notes (Addendum)
  Subjective:  Patient appears to be in moderate pain. Objective:   VITALS:   Vitals:   02/08/17 0244 02/08/17 0353 02/08/17 0431 02/08/17 0726  BP: (!) 114/53 118/73  136/72  Pulse: (!) 121 (!) 52  79  Resp: 19 19  20   Temp: (!) 101.1 F (38.4 C) 99 F (37.2 C)  98.5 F (36.9 C)  TempSrc: Oral Axillary  Oral  SpO2: 94% 96%  99%  Weight:   57.6 kg (127 lb)   Height:        PHYSICAL EXAM:  Sensation intact distally Dorsiflexion/Plantar flexion intact Incision: dressing C/D/I Compartment soft  LABS  Results for orders placed or performed during the hospital encounter of 02/06/17 (from the past 24 hour(s))  Comprehensive metabolic panel     Status: Abnormal   Collection Time: 02/08/17  4:29 AM  Result Value Ref Range   Sodium 139 135 - 145 mmol/L   Potassium 2.7 (LL) 3.5 - 5.1 mmol/L   Chloride 108 101 - 111 mmol/L   CO2 25 22 - 32 mmol/L   Glucose, Bld 121 (H) 65 - 99 mg/dL   BUN 12 6 - 20 mg/dL   Creatinine, Ser 1.610.52 0.44 - 1.00 mg/dL   Calcium 8.3 (L) 8.9 - 10.3 mg/dL   Total Protein 5.5 (L) 6.5 - 8.1 g/dL   Albumin 3.1 (L) 3.5 - 5.0 g/dL   AST 23 15 - 41 U/L   ALT 12 (L) 14 - 54 U/L   Alkaline Phosphatase 48 38 - 126 U/L   Total Bilirubin 0.6 0.3 - 1.2 mg/dL   GFR calc non Af Amer >60 >60 mL/min   GFR calc Af Amer >60 >60 mL/min   Anion gap 6 5 - 15  CBC     Status: Abnormal   Collection Time: 02/08/17  4:29 AM  Result Value Ref Range   WBC 9.4 3.6 - 11.0 K/uL   RBC 3.46 (L) 3.80 - 5.20 MIL/uL   Hemoglobin 11.0 (L) 12.0 - 16.0 g/dL   HCT 09.633.4 (L) 04.535.0 - 40.947.0 %   MCV 96.6 80.0 - 100.0 fL   MCH 31.8 26.0 - 34.0 pg   MCHC 32.9 32.0 - 36.0 g/dL   RDW 81.112.6 91.411.5 - 78.214.5 %   Platelets 113 (L) 150 - 440 K/uL    Dg Hip Operative Unilat With Pelvis Right  Result Date: 02/07/2017 CLINICAL DATA:  Postop right hip surgery EXAM: OPERATIVE RIGHT HIP (WITH PELVIS IF PERFORMED) 2 VIEWS TECHNIQUE: Fluoroscopic spot image(s) were submitted for interpretation  post-operatively. COMPARISON:  02/06/2017 FINDINGS: Placement of 3 screws across the right femoral neck fracture. Near anatomic alignment. No hardware complicating feature. IMPRESSION: Internal fixation as above.  No visible complicating feature. Electronically Signed   By: Charlett NoseKevin  Dover M.D.   On: 02/07/2017 08:48    Assessment/Plan: 1 Day Post-Op   Active Problems:   Hip fracture [facility](HCC)   Discharge to SNF, she is cleared from an orthopedic standpoint, she will follow-up in 2 weeks. She may weight bear as tolerated and use ASA for DVT prophylaxis.   Lyndle HerrlichJames R Yaira Bernardi , MD 02/08/2017, 7:57 AM

## 2017-02-08 NOTE — Progress Notes (Addendum)
Pharmacy Electrolyte Monitoring Consult:  Pharmacy consulted to assist in monitoring and replacing electrolytes in this 83 y.o. female admitted on 02/06/2017 with hip fracture s/p repair.   Labs:  Sodium (mmol/L)  Date Value  02/08/2017 139  04/08/2013 138   Potassium (mmol/L)  Date Value  02/08/2017 3.7  04/08/2013 4.2   Magnesium (mg/dL)  Date Value  40/98/119112/21/2018 1.7  12/16/2012 1.9   Calcium (mg/dL)  Date Value  47/82/956212/21/2018 8.3 (L)   Calcium, Total (mg/dL)  Date Value  13/08/657802/18/2015 8.6   Albumin (g/dL)  Date Value  46/96/295212/21/2018 3.1 (L)  04/07/2013 3.9    Plan: KCl 10 meq iv x 1 and KCl 40 meq po q 2 hours x 2 doses given today.  Mg sulfate 2 g IV given today.   K=3.7  Both K and Mg within normal limits. Will recheck K with AM labs if patient here tomorrow.   Cleopatra CedarStephanie Yarethzi Branan 02/08/2017 3:42 PM                      Addendum 12/21 @ 1200: Mg= 1.7. Will order magnesium sulfate 2 g iv once.

## 2017-02-09 LAB — COMPREHENSIVE METABOLIC PANEL
ALK PHOS: 49 U/L (ref 38–126)
ALT: 10 U/L — ABNORMAL LOW (ref 14–54)
ANION GAP: 4 — AB (ref 5–15)
AST: 17 U/L (ref 15–41)
Albumin: 2.8 g/dL — ABNORMAL LOW (ref 3.5–5.0)
BILIRUBIN TOTAL: 0.4 mg/dL (ref 0.3–1.2)
BUN: 15 mg/dL (ref 6–20)
CALCIUM: 8.3 mg/dL — AB (ref 8.9–10.3)
CO2: 24 mmol/L (ref 22–32)
Chloride: 111 mmol/L (ref 101–111)
Creatinine, Ser: 0.72 mg/dL (ref 0.44–1.00)
GFR calc non Af Amer: >60 mL/min (ref >60)
Glucose, Bld: 104 mg/dL — ABNORMAL HIGH (ref 65–99)
POTASSIUM: 4.4 mmol/L (ref 3.5–5.1)
SODIUM: 139 mmol/L (ref 135–145)
TOTAL PROTEIN: 5.2 g/dL — AB (ref 6.5–8.1)

## 2017-02-09 MED ORDER — ENOXAPARIN SODIUM 40 MG/0.4ML ~~LOC~~ SOLN: 40.0000 mg | SUBCUTANEOUS | Status: DC

## 2017-02-09 MED ORDER — ENOXAPARIN SODIUM 40 MG/0.4ML ~~LOC~~ SOLN
40.0000 mg | SUBCUTANEOUS | Status: DC
  Administered 2017-02-09: 40 mg via SUBCUTANEOUS
  Filled 2017-02-09: qty 0.4

## 2017-02-09 MED ORDER — FLEET ENEMA 7-19 GM/118ML RE ENEM
1.0000 | ENEMA | Freq: Once | RECTAL | Status: AC
  Administered 2017-02-09: 1 via RECTAL

## 2017-02-09 MED ORDER — BISACODYL 10 MG RE SUPP: 10.0000 mg | Freq: Once | RECTAL | Status: DC

## 2017-02-09 NOTE — Progress Notes (Signed)
Patient had BM. EMS called.

## 2017-02-09 NOTE — Progress Notes (Addendum)
[facility] is being [facility], [facility] Discharge printed for patient. IV removed.

## 2017-02-09 NOTE — Progress Notes (Signed)
Patient picked up by EMS.  

## 2017-02-09 NOTE — Discharge Summary (Signed)
[facility] [place] [facility]   PATIENT NAME: Donna Goodman    MR#:  [id number]  DATE OF BIRTH:  Oct 16, 1927  DATE OF ADMISSION:  02/06/2017 ADMITTING PHYSICIAN: Arnaldo NatalMichael S Diamond, MD  DATE OF DISCHARGE: 02/09/2017  PRIMARY CARE PHYSICIAN: System, Pcp Not In    ADMISSION DIAGNOSIS:  Closed fracture of neck of right femur, initial encounter (HCC) [S72.001A] Fall, initial encounter [W19.XXXA]  DISCHARGE DIAGNOSIS:  Active Problems:   Hip fracture (HCC)   SECONDARY DIAGNOSIS:   Past Medical History:  Diagnosis Date  . Asthma   . COPD (chronic obstructive pulmonary disease) (HCC)   . Dementia   . Hyperlipemia   . Hypertension     HOSPITAL COURSE:    83 year old female with a history of COPD and dementia that is post mechanical fall and found to have right hip fracture.  1. Right hip fracture: Patient is postoperative day #2 Management as per orthopedic surgery DVT prophylaxis with lovenox for 14 days.  2. Dementia/depression: Continue Aricept, Depakote, Zoloft  3. Essential hypertension: Continue metoprolol  4. Hypothyroidism: Continue Synthroid  5. Hyperlipidemia: Continue statin  6. Acute hypoxic respiratory failure: Chest x-ray: unremarkable. Weaned oxygen.  7. Hypokalemia: improved with KCl.  DISCHARGE CONDITIONS AND DIET:   Stable, discharge to SNF today. Regular diet  CONSULTS OBTAINED:    DRUG ALLERGIES:  No Known Allergies  DISCHARGE MEDICATIONS:   Allergies as of 02/09/2017   No Known Allergies     Medication List    TAKE these medications   acetaminophen 500 MG tablet Commonly known as:  TYLENOL Take 500 mg by mouth 3 (three) times daily.   acetaminophen 500 MG tablet Commonly known as:  TYLENOL Take 500 mg by mouth every 8 (eight) hours as needed for mild pain, fever or headache.   albuterol 108 (90 Base) MCG/ACT inhaler Commonly known as:  PROVENTIL HFA;VENTOLIN HFA Inhale 2 puffs into the lungs  every 6 (six) hours as needed for wheezing or shortness of breath.   alum & mag hydroxide-simeth 200-200-20 MG/5ML suspension Commonly known as:  MAALOX/MYLANTA Take 30 mLs by mouth 4 (four) times daily as needed for indigestion or heartburn.   atorvastatin 20 MG tablet Commonly known as:  LIPITOR Take 20 mg by mouth at bedtime.   calcium citrate 950 MG tablet Commonly known as:  CALCITRATE - dosed in mg elemental calcium Take 100 mg of elemental calcium by mouth daily.   cetirizine 10 MG tablet Commonly known as:  ZYRTEC Take 10 mg by mouth daily.   diclofenac sodium 1 % Gel Commonly known as:  VOLTAREN Apply 2 g topically 2 (two) times daily. To right hip for pain   divalproex 125 MG DR tablet Commonly known as:  DEPAKOTE Take 125 mg by mouth 3 (three) times daily.   donepezil 5 MG tablet Commonly known as:  ARICEPT Take 5 mg by mouth at bedtime.   enoxaparin 40 MG/0.4ML injection Commonly known as:  LOVENOX Inject 0.4 mLs (40 mg total) into the skin daily.   Fluticasone-Salmeterol 100-50 MCG/DOSE Aepb Commonly known as:  ADVAIR Inhale 1 puff into the lungs 2 (two) times daily.   guaifenesin 100 MG/5ML syrup Commonly known as:  ROBITUSSIN Take 200 mg by mouth every 6 (six) hours as needed for cough.   ibuprofen 400 MG tablet Commonly known as:  ADVIL,MOTRIN Take 400 mg by mouth 3 (three) times daily.   levothyroxine 100 MCG tablet Commonly known as:  SYNTHROID, LEVOTHROID Take 100  mcg by mouth daily.   loperamide 2 MG capsule Commonly known as:  IMODIUM Take 2 mg by mouth as needed for diarrhea or loose stools.   magnesium hydroxide 400 MG/5ML suspension Commonly known as:  MILK OF MAGNESIA Take 30 mLs by mouth at bedtime as needed for mild constipation.   metoprolol succinate 25 MG 24 hr tablet Commonly known as:  TOPROL-XL Take 12.5 mg by mouth daily.   multivitamin with minerals Tabs tablet Take 1 tablet by mouth daily.   MUSCLE RUB 10-15 %  Crea Apply 1 application topically 3 (three) times daily as needed (arm pain).   neomycin-bacitracin-polymyxin [phone] ointment Apply 1 application topically as needed (minor skin tears or abrasion).   saccharomyces boulardii 250 MG capsule Commonly known as:  FLORASTOR Take 250 mg by mouth daily.   sertraline 50 MG tablet Commonly known as:  ZOLOFT Take 50 mg by mouth daily.   topiramate 25 MG tablet Commonly known as:  TOPAMAX Take 25 mg by mouth at bedtime.   Vitamin D 2000 units Caps Take 2,000 Units by mouth daily.         Today   CHIEF COMPLAINT:   Patient with dementia   VITAL SIGNS:  Blood pressure (!) 167/71, pulse (!) 52, temperature 97.7 F (36.5 C), temperature source Oral, resp. rate 18, height 5\' 4"  (1.626 m), weight 127 lb (57.6 kg), SpO2 100 %.   REVIEW OF SYSTEMS:  Review of Systems  Unable to perform ROS: Dementia     PHYSICAL EXAMINATION:  GENERAL:  83 y.o.-year-old patient lying in the bed with no acute distress.  NECK:  Supple, no jugular venous distention. No thyroid enlargement, no tenderness.  LUNGS: Normal breath sounds bilaterally, no wheezing, rales,rhonchi  No use of accessory muscles of respiration.  CARDIOVASCULAR: S1, S2 normal. No murmurs, rubs, or gallops.  ABDOMEN: Soft, non-tender, non-distended. Bowel sounds present. No organomegaly or mass.  EXTREMITIES: No pedal edema, cyanosis, or clubbing.  PSYCHIATRIC: The patient is demented. SKIN: No obvious rash, lesion, or ulcer.   DATA REVIEW:   CBC Recent Labs  Lab 02/08/17 0429  WBC 9.4  HGB 11.0*  HCT 33.4*  PLT 113*    Chemistries  Recent Labs  Lab 02/08/17 0429  02/09/17 0318  NA 139  --  139  K 2.7*   < > 4.4  CL 108  --  111  CO2 25  --  24  GLUCOSE 121*  --  104*  BUN 12  --  15  CREATININE 0.52  --  0.72  CALCIUM 8.3*  --  8.3*  MG 1.7  --   --   AST 23  --  17  ALT 12*  --  10*  ALKPHOS 48  --  49  BILITOT 0.6  --  0.4   < > = values in this  interval not displayed.    Cardiac Enzymes No results for input(s): TROPONINI in the last 168 hours.  Microbiology Results  @MICRORSLT48 @  RADIOLOGY:  Dg Chest 1 View  Result Date: 02/08/2017 CLINICAL DATA:  Hypoxia. ORIF right femoral neck fracture yesterday. EXAM: Portable CHEST 1 VIEW COMPARISON:  02/06/2017, 05/25/2016 and earlier, including CT chest 01/29/2016. FINDINGS: Cardiac silhouette mildly enlarged. Thoracic aorta tortuous and atherosclerotic. Hilar and mediastinal contours otherwise unremarkable. Mild atelectasis at the lung bases, left greater than right. Lungs otherwise clear. Pulmonary vascularity normal. Possible small pleural effusions. IMPRESSION: Bibasilar atelectasis, left greater than right, and possible small bilateral pleural effusions.  No acute cardiopulmonary disease otherwise. Electronically Signed   By: Hulan Saas M.D.   On: 02/08/2017 11:56      Allergies as of 02/09/2017   No Known Allergies     Medication List    TAKE these medications   acetaminophen 500 MG tablet Commonly known as:  TYLENOL Take 500 mg by mouth 3 (three) times daily.   acetaminophen 500 MG tablet Commonly known as:  TYLENOL Take 500 mg by mouth every 8 (eight) hours as needed for mild pain, fever or headache.   albuterol 108 (90 Base) MCG/ACT inhaler Commonly known as:  PROVENTIL HFA;VENTOLIN HFA Inhale 2 puffs into the lungs every 6 (six) hours as needed for wheezing or shortness of breath.   alum & mag hydroxide-simeth 200-200-20 MG/5ML suspension Commonly known as:  MAALOX/MYLANTA Take 30 mLs by mouth 4 (four) times daily as needed for indigestion or heartburn.   atorvastatin 20 MG tablet Commonly known as:  LIPITOR Take 20 mg by mouth at bedtime.   calcium citrate 950 MG tablet Commonly known as:  CALCITRATE - dosed in mg elemental calcium Take 100 mg of elemental calcium by mouth daily.   cetirizine 10 MG tablet Commonly known as:  ZYRTEC Take 10 mg by  mouth daily.   diclofenac sodium 1 % Gel Commonly known as:  VOLTAREN Apply 2 g topically 2 (two) times daily. To right hip for pain   divalproex 125 MG DR tablet Commonly known as:  DEPAKOTE Take 125 mg by mouth 3 (three) times daily.   donepezil 5 MG tablet Commonly known as:  ARICEPT Take 5 mg by mouth at bedtime.   enoxaparin 40 MG/0.4ML injection Commonly known as:  LOVENOX Inject 0.4 mLs (40 mg total) into the skin daily.   Fluticasone-Salmeterol 100-50 MCG/DOSE Aepb Commonly known as:  ADVAIR Inhale 1 puff into the lungs 2 (two) times daily.   guaifenesin 100 MG/5ML syrup Commonly known as:  ROBITUSSIN Take 200 mg by mouth every 6 (six) hours as needed for cough.   ibuprofen 400 MG tablet Commonly known as:  ADVIL,MOTRIN Take 400 mg by mouth 3 (three) times daily.   levothyroxine 100 MCG tablet Commonly known as:  SYNTHROID, LEVOTHROID Take 100 mcg by mouth daily.   loperamide 2 MG capsule Commonly known as:  IMODIUM Take 2 mg by mouth as needed for diarrhea or loose stools.   magnesium hydroxide 400 MG/5ML suspension Commonly known as:  MILK OF MAGNESIA Take 30 mLs by mouth at bedtime as needed for mild constipation.   metoprolol succinate 25 MG 24 hr tablet Commonly known as:  TOPROL-XL Take 12.5 mg by mouth daily.   multivitamin with minerals Tabs tablet Take 1 tablet by mouth daily.   MUSCLE RUB 10-15 % Crea Apply 1 application topically 3 (three) times daily as needed (arm pain).   neomycin-bacitracin-polymyxin [phone] ointment Apply 1 application topically as needed (minor skin tears or abrasion).   saccharomyces boulardii 250 MG capsule Commonly known as:  FLORASTOR Take 250 mg by mouth daily.   sertraline 50 MG tablet Commonly known as:  ZOLOFT Take 50 mg by mouth daily.   topiramate 25 MG tablet Commonly known as:  TOPAMAX Take 25 mg by mouth at bedtime.   Vitamin D 2000 units Caps Take 2,000 Units by mouth daily.          Management plans discussed with the patient's family and they are in agreement. Stable for discharge   Patient should follow up with  ortho  CODE STATUS:     Code Status Orders  (From admission, onward)        Start     Ordered   02/06/17 0843  Do not attempt resuscitation (DNR)  Continuous    Question Answer Comment  In the event of cardiac or respiratory ARREST Do not call a "code blue"   In the event of cardiac or respiratory ARREST Do not perform Intubation, CPR, defibrillation or ACLS   In the event of cardiac or respiratory ARREST Use medication by any route, position, wound care, and other measures to relive pain and suffering. May use oxygen, suction and manual treatment of airway obstruction as needed for comfort.      02/06/17 8469    Code Status History    Date Active Date Inactive Code Status Order ID Comments User Context   01/29/2016 17:35 02/01/2016 18:01 Full Code [id number]  Marguarite Arbour, MD Inpatient    Advance Directive Documentation     Most Recent Value  Type of Advance Directive  Healthcare Power of Attorney, Living will, Out of facility DNR (pink MOST or yellow form)  Pre-existing out of facility DNR order (yellow form or pink MOST form)  Yellow form placed in chart (order not valid for inpatient use)  "MOST" Form in Place?  No data      TOTAL TIME TAKING CARE OF THIS PATIENT: 33 minutes.    Note: This dictation was prepared with Dragon dictation along with smaller phrase technology. Any transcriptional errors that result from this process are unintentional.  Shaune Pollack M.D on 02/09/2017 at 8:59 AM  Between 7am to 6pm - Pager - [phone] After 6pm go to [email or web address] - password [email or web address]  [facility]  Office  [phone]  CC: Primary care physician; System, Pcp Not In

## 2017-02-09 NOTE — Progress Notes (Signed)
PT Cancellation Note  Patient Details Name: Donna Goodman MRN: [id number] DOB: 1927-05-12   Cancelled Treatment:    Reason Eval/Treat Not Completed: Patient at procedure or test/unavailable;Other (comment)(Attempted PM treatment with patient. RN reports patient just received enema and is incontinent of bowels. The patient is awaiting a full bowel movement for imminent DC to facility. PT to hold treatment at this time as not to interfere with DC plans. )   2:49 PM, 02/09/17 Rosamaria LintsAllan C Buccola, PT, DPT Physical Therapist - [place] [phone] [facility](ASCOM)  [phone] (mobile)    Buccola,Allan C 02/09/2017, 2:49 PM

## 2017-02-09 NOTE — Clinical Social Work Note (Signed)
The [facility] Room [facility], Report number [phone], via non-emergent EMS. The patient's family and facility are aware and are in agreement. The facility has received all documentation, and the packet has been delivered to the chart, including DNR. CSW will continue to follow pending additional discharge needs.  Argentina PonderKaren Martha Duwane Gewirtz, MSW, Theresia MajorsLCSWA [phone]

## 2017-02-09 NOTE — Progress Notes (Signed)
PT Cancellation Note  Patient Details Name: Donna Goodman MRN: [id number] DOB: [birth year]   Cancelled Treatment:    Reason Eval/Treat Not Completed: Patient at procedure or test/unavailable;Other (comment)(Chart reviewed, RN consulted. PT attempted treatment, husband in room feeding patient. Husband reports she likely did not eat dinner last night and he want to make sure she eats something. Will try back again this afternoon if patient is still here. )  10:06 AM, 02/09/17 Rosamaria LintsAllan C Buccola, PT, DPT Physical Therapist - [place] [phone] [facility](ASCOM)  [phone] (mobile)   Buccola,Allan C 02/09/2017, 10:06 AM

## 2017-02-19 ENCOUNTER — Encounter
Admission: RE | Admit: 2017-02-19 | Discharge: 2017-02-19 | Disposition: A | Discharge status: Home / Self Care | Payer: Medicare Other | Source: Ambulatory Visit | Attending: Internal Medicine | Admitting: Internal Medicine

## 2017-02-20 ENCOUNTER — Non-Acute Institutional Stay (SKILLED_NURSING_FACILITY): Payer: Medicare Other | Admitting: Gerontology

## 2017-02-20 DIAGNOSIS — S72001D Fracture of unspecified part of neck of right femur, subsequent encounter for closed fracture with routine healing: Secondary | ICD-10-CM | POA: Diagnosis not present

## 2017-02-21 ENCOUNTER — Encounter: Payer: Self-pay | Admitting: Gerontology

## 2017-02-21 NOTE — Progress Notes (Signed)
Location:   [facility] Room Number: 203B Place of Service:  SNF [phone]) Provider:  Lorenso Quarry, NP-C  System, Pcp Not In  Patient Care Team: System, Pcp Not In as PCP - General  Extended Emergency Contact Information Primary Emergency Contact: Norris,Cecil Address: [address]          [place], [place] [address] [place] Home Phone: [phone] Mobile Phone: [phone] Relation: Brother Secondary Emergency Contact: Wilfred [place] of [place] Home Phone: [phone] Mobile Phone: [phone] Relation: Brother  Code Status:  DNR Goals of care: Advanced Directive information Advanced Directives 02/21/2017  Does Patient Have a Medical Advance Directive? Yes  Type of Advance Directive Out of facility DNR (pink MOST or yellow form)  Does patient want to make changes to medical advance directive? No - Patient declined  Copy of Healthcare Power of Attorney in Chart? -  Would patient like information on creating a medical advance directive? -  Pre-existing out of facility DNR order (yellow form or pink MOST form) -     Chief Complaint  Patient presents with  . Medical Management of Chronic Issues    Routine Visit    HPI:  Pt is a 84 y.o. female seen today for medical management of chronic diseases. Pt was admitted to the facility for rehab following admission to [facility] for Right Hip Fracture with Cannulated Hip Pinning. Pt has been participating in PT and OT. Pt reports her pain is well controlled on current regimen. Appetite is good, voiding without difficulty, having regular BMs. Intermittent confusion. Continues to be max assist with transfers. Pt is mobile on the unit in a wheelchair. VSS. No other complaints.   Please note pt with limited verbal ability. Unable to obtain complete ROS. Some ROS info obtained from staff and documentation.   Past Medical History:  Diagnosis Date  . Anxiety    unspecified  . Asthma   .  Asthma without status asthmaticus    unspecified  . Cataract   . COPD (chronic obstructive pulmonary disease) (HCC)   . Dementia   . Depression    unspecified  . Hyperlipemia    unspecified  . Hypertension   . Thyroid disease   . Visual impairment    Past Surgical History:  Procedure Laterality Date  . BACK SURGERY    . HIP PINNING,CANNULATED Right 02/07/2017   Procedure: CANNULATED HIP PINNING;  Surgeon: Lyndle Herrlich, MD;  Location: [facility] ORS;  Service: Orthopedics;  Laterality: Right;    Allergies  Allergen Reactions  . Codeine Nausea And Vomiting    Other reaction(s): Hives    Allergies as of 02/20/2017   No Known Allergies     Medication List        Accurate as of 02/20/17 11:59 PM. Always use your most recent med list.          acetaminophen 500 MG tablet Commonly known as:  TYLENOL Take 500 mg by mouth 3 (three) times daily.   acetaminophen 500 MG tablet Commonly known as:  TYLENOL Take 500 mg by mouth every 8 (eight) hours as needed for mild pain, fever or headache.   albuterol 108 (90 Base) MCG/ACT inhaler Commonly known as:  PROVENTIL HFA;VENTOLIN HFA Inhale 2 puffs into the lungs every 6 (six) hours as needed for wheezing or shortness of breath.   alum & mag hydroxide-simeth 200-200-20 MG/5ML suspension Commonly known as:  MAALOX/MYLANTA Take 30 mLs by mouth 4 (four) times daily  as needed for indigestion or heartburn.   atorvastatin 20 MG tablet Commonly known as:  LIPITOR Take 20 mg by mouth at bedtime.   calcium citrate 950 MG tablet Commonly known as:  CALCITRATE - dosed in mg elemental calcium Take 100 mg of elemental calcium by mouth daily.   cetirizine 10 MG tablet Commonly known as:  ZYRTEC Take 10 mg by mouth daily.   diclofenac sodium 1 % Gel Commonly known as:  VOLTAREN Apply 2 g topically 2 (two) times daily. To right hip for pain   divalproex 125 MG DR tablet Commonly known as:  DEPAKOTE Take 125 mg by mouth 3 (three) times  daily. May open capsule and mix granules with food   donepezil 5 MG tablet Commonly known as:  ARICEPT Take 5 mg by mouth at bedtime.   enoxaparin 40 MG/0.4ML injection Commonly known as:  LOVENOX Inject 0.4 mLs (40 mg total) into the skin daily.   ENSURE ENLIVE PO Take 1 Bottle by mouth 2 (two) times daily between meals.   feeding supplement (PRO-STAT SUGAR FREE 64) Liqd Take 30 mLs by mouth 2 (two) times daily between meals.   Fluticasone-Salmeterol 100-50 MCG/DOSE Aepb Commonly known as:  ADVAIR Inhale 1 puff into the lungs 2 (two) times daily.   guaiFENesin 100 MG/5ML liquid Commonly known as:  ROBITUSSIN Take 200 mg by mouth every 6 (six) hours as needed for cough.   ibuprofen 400 MG tablet Commonly known as:  ADVIL,MOTRIN Take 400 mg by mouth 3 (three) times daily.   levothyroxine 100 MCG tablet Commonly known as:  SYNTHROID, LEVOTHROID Take 100 mcg by mouth daily.   loperamide 2 MG capsule Commonly known as:  IMODIUM Take 2 mg by mouth as needed for diarrhea or loose stools.   magnesium hydroxide 400 MG/5ML suspension Commonly known as:  MILK OF MAGNESIA Take 30 mLs by mouth at bedtime as needed for mild constipation.   metoprolol succinate 25 MG 24 hr tablet Commonly known as:  TOPROL-XL Take 12.5 mg by mouth daily. Hold for pulse < 60   multivitamin with minerals Tabs tablet Take 1 tablet by mouth daily.   MUSCLE RUB 10-15 % Crea Apply 1 application topically 3 (three) times daily as needed (arm pain).   neomycin-bacitracin-polymyxin [phone] ointment Apply 1 application topically as needed (minor skin tears or abrasion).   RISA-BID PROBIOTIC Tabs Take 1 tablet by mouth daily.   sertraline 50 MG tablet Commonly known as:  ZOLOFT Take 50 mg by mouth daily.   topiramate 25 MG tablet Commonly known as:  TOPAMAX Take 25 mg by mouth at bedtime.   Vitamin D 2000 units Caps Take 2,000 Units by mouth daily.       Review of Systems  Unable to  perform ROS: Dementia  Constitutional: Negative for activity change, appetite change, chills, diaphoresis and fever.  HENT: Negative for congestion, mouth sores, nosebleeds, postnasal drip, sneezing, sore throat, trouble swallowing and voice change.   Respiratory: Negative for apnea, cough, choking, chest tightness, shortness of breath and wheezing.   Cardiovascular: Negative for chest pain, palpitations and leg swelling.  Gastrointestinal: Negative for abdominal distention, abdominal pain, constipation, diarrhea and nausea.  Genitourinary: Negative for difficulty urinating, dysuria, frequency and urgency.  Musculoskeletal: Positive for arthralgias (typical arthritis) and gait problem. Negative for back pain and myalgias.  Skin: Positive for wound. Negative for color change, pallor and rash.  Neurological: Negative for dizziness, tremors, syncope, speech difficulty, weakness, numbness and headaches.  Psychiatric/Behavioral: Positive  for confusion. Negative for agitation and behavioral problems.  All other systems reviewed and are negative.   Immunization History  Administered Date(s) Administered  . H1N1 03/16/2008  . Influenza, Seasonal, Injecte, Preservative Fre 12/02/2008, 11/28/2009, 11/27/2010, 12/17/2011  . Influenza,inj,Quad PF,6+ Mos 12/05/2012  . PPD Test 06/29/2014  . Pneumococcal Polysaccharide-23 08/21/2012  . Td 02/28/2007   There are no preventive care reminders to display for this patient. No flowsheet data found. Functional Status Survey:    Vitals:   02/20/17 0930  BP: (!) 139/57  Pulse: 60  Resp: 18  Temp: 98 F (36.7 C)  TempSrc: Oral  SpO2: 97%  Weight: 118 lb 3.2 oz (53.6 kg)  Height: 5\' 4"  (1.626 m)   Body mass index is 20.29 kg/m. Physical Exam  Constitutional: Vital signs are normal. She appears well-developed and well-nourished. She is active and cooperative. She does not appear ill. No distress.  HENT:  Head: Normocephalic and atraumatic.    Mouth/Throat: Uvula is midline, oropharynx is clear and moist and mucous membranes are normal. Mucous membranes are not pale, not dry and not cyanotic.  Eyes: Conjunctivae, EOM and lids are normal. Pupils are equal, round, and reactive to light.  Neck: Trachea normal, normal range of motion and full passive range of motion without pain. Neck supple. No JVD present. No tracheal deviation, no edema and no erythema present. No thyromegaly present.  Cardiovascular: Normal rate, regular rhythm, normal heart sounds, intact distal pulses and normal pulses. Exam reveals no gallop, no distant heart sounds and no friction rub.  No murmur heard. Pulses:      Dorsalis pedis pulses are 2+ on the right side, and 2+ on the left side.  No edema  Pulmonary/Chest: Effort normal and breath sounds normal. No accessory muscle usage. No respiratory distress. She has no decreased breath sounds. She has no wheezes. She has no rhonchi. She has no rales. She exhibits no tenderness.  Abdominal: Soft. Normal appearance and bowel sounds are normal. She exhibits no distension and no ascites. There is no tenderness.  Musculoskeletal: She exhibits no edema.       Right hip: She exhibits decreased range of motion, decreased strength, tenderness and laceration.  Expected osteoarthritis, stiffness; Bilateral Calves soft, supple. Negative Homan's Sign. B- pedal pulses equal; generalized weakness, mobile with wheelchair  Neurological: She is alert. She has normal strength. A cranial nerve deficit is present. Coordination and gait abnormal.  Skin: Skin is warm and dry. Laceration noted. She is not diaphoretic. No cyanosis. No pallor. Nails show no clubbing.  Psychiatric: She has a normal mood and affect. Her speech is normal and behavior is normal. Judgment and thought content normal. Cognition and memory are impaired. She exhibits abnormal recent memory and abnormal remote memory.  Nursing note and vitals reviewed.   Labs  reviewed: Recent Labs    02/06/17 0608 02/08/17 0429 02/08/17 1442 02/09/17 0318  NA 142 139  --  139  K 3.6 2.7* 3.7 4.4  CL 108 108  --  111  CO2 27 25  --  24  GLUCOSE 107* 121*  --  104*  BUN 23* 12  --  15  CREATININE 0.76 0.52  --  0.72  CALCIUM 9.3 8.3*  --  8.3*  MG  --  1.7  --   --    Recent Labs    05/25/16 0941 02/08/17 0429 02/09/17 0318  AST 22 23 17   ALT 13* 12* 10*  ALKPHOS 51 48 49  BILITOT 0.7 0.6 0.4  PROT 6.2* 5.5* 5.2*  ALBUMIN 3.1* 3.1* 2.8*   Recent Labs    05/25/16 0941 10/16/16 1853 02/06/17 0608 02/08/17 0429  WBC 11.1* 6.9 11.3* 9.4  NEUTROABS 8.1*  --  9.0*  --   HGB 11.1* 11.8* 12.6 11.0*  HCT 33.2* 35.3 38.4 33.4*  MCV 92.6 96.4 96.1 96.6  PLT 208 213 155 113*   Lab Results  Component Value Date   TSH 0.537 02/06/2017   No results found for: HGBA1C No results found for: CHOL, HDL, LDLCALC, LDLDIRECT, TRIG, CHOLHDL  Significant Diagnostic Results in last 30 days:  Dg Chest 1 View  Result Date: 02/08/2017 CLINICAL DATA:  Hypoxia. ORIF right femoral neck fracture yesterday. EXAM: Portable CHEST 1 VIEW COMPARISON:  02/06/2017, 05/25/2016 and earlier, including CT chest 01/29/2016. FINDINGS: Cardiac silhouette mildly enlarged. Thoracic aorta tortuous and atherosclerotic. Hilar and mediastinal contours otherwise unremarkable. Mild atelectasis at the lung bases, left greater than right. Lungs otherwise clear. Pulmonary vascularity normal. Possible small pleural effusions. IMPRESSION: Bibasilar atelectasis, left greater than right, and possible small bilateral pleural effusions. No acute cardiopulmonary disease otherwise. Electronically Signed   By: Hulan Saas M.D.   On: 02/08/2017 11:56   Dg Shoulder Right  Result Date: 02/06/2017 CLINICAL DATA:  Unwitnessed fall in the shower, right shoulder, elbow and hip pain. EXAM: RIGHT SHOULDER - 2+ VIEW COMPARISON:  None. FINDINGS: No acute fracture or dislocation. Glenohumeral  osteoarthritis, at least moderate in degree. Mild acromioclavicular degenerative change. IMPRESSION: Degenerative change of the right shoulder without acute fracture or subluxation. Electronically Signed   By: Rubye Oaks M.D.   On: 02/06/2017 05:19   Dg Elbow Complete Right  Result Date: 02/06/2017 CLINICAL DATA:  Unwitnessed fall in the shower, right shoulder, elbow and hip pain. EXAM: RIGHT ELBOW - COMPLETE 3+ VIEW COMPARISON:  None. FINDINGS: There is no evidence of fracture, dislocation, or joint effusion. There is no evidence of arthropathy or other focal bone abnormality. Focal soft tissue prominence overlies the olecranon. IMPRESSION: 1. No fracture, dislocation, or joint effusion. 2. Focal soft tissue prominence overlies the olecranon, likely hematoma in the setting of injury. Alternatively, olecranon bursitis could have a similar appearance, recommend correlation for acuity. Electronically Signed   By: Rubye Oaks M.D.   On: 02/06/2017 05:21   Dg Chest Port 1 View  Result Date: 02/06/2017 CLINICAL DATA:  Fall, right hip fracture. EXAM: PORTABLE CHEST 1 VIEW COMPARISON:  Radiograph 05/25/2016 FINDINGS: The cardiomediastinal contours are normal. Previous streaky left lung base opacity has resolved. Pulmonary vasculature is normal. No consolidation, pleural effusion, or pneumothorax. No acute osseous abnormalities are seen. Chronic change of the right shoulder. IMPRESSION: No acute abnormality. Electronically Signed   By: Rubye Oaks M.D.   On: 02/06/2017 05:59   Dg Hip Operative Unilat With Pelvis Right  Result Date: 02/07/2017 CLINICAL DATA:  Postop right hip surgery EXAM: OPERATIVE RIGHT HIP (WITH PELVIS IF PERFORMED) 2 VIEWS TECHNIQUE: Fluoroscopic spot image(s) were submitted for interpretation post-operatively. COMPARISON:  02/06/2017 FINDINGS: Placement of 3 screws across the right femoral neck fracture. Near anatomic alignment. No hardware complicating feature.  IMPRESSION: Internal fixation as above.  No visible complicating feature. Electronically Signed   By: Charlett Nose M.D.   On: 02/07/2017 08:48   Dg Hip Unilat With Pelvis 2-3 Views Right  Result Date: 02/06/2017 CLINICAL DATA:  Unwitnessed fall in the shower, right shoulder, elbow and hip pain. EXAM: DG HIP (WITH OR WITHOUT PELVIS) 2-3V RIGHT  COMPARISON:  Radiographs 01/21/2015 FINDINGS: Impacted subcapital right femoral neck fracture with mild displacement. Femoral head remains seated. Pubic rami are intact. No additional fracture of the pelvis. The bones are under mineralized. IMPRESSION: Impacted subcapital right femoral neck fracture. Electronically Signed   By: Rubye Oaks M.D.   On: 02/06/2017 05:20    Assessment/Plan Closed fracture of right hip with routine healing, subsequent encounter  COntinue PT/OT  Continue exercises as taught by PT/OT  Ice pack to the hip prn for edema, pain  Skin care per protocol  Continue Lovenox 40 mg SQ Q Day for DVT prophylaxis  Continue Tylenol 500 mg po TID scheduled for pain  Continue Tylenol 500 mg po Q 8 hrs prn for pain  Follow up with Orthopedist as instructed  Family/ staff Communication:   Total Time:  Documentation:  Face to Face:  Family/Phone:   Labs/tests ordered:    Medication list reviewed and assessed for continued appropriateness. Monthly medication orders reviewed and signed.  Patient needs a standard wheelchair.  Patient is able to propel self and maintain current level of independence using a wheelchair.  Patient is unable to perform these same daily functions with a walker, cane or other alternative assistive device. Right Hip Fracture. Pt is max assist for transfers. Dementia. Unable to ambulate safely with walker     Brynda Rim, NP-C Geriatrics [facility] [phone] [address] [address], [place] [address] Cell Phone (Mon-Fri 8am-5pm):  [phone] On Call:  [phone] &  follow prompts after 5pm & weekends Office Phone:  [phone] Office Fax:  [phone]

## 2017-04-26 ENCOUNTER — Emergency Department
Admission: EM | Admit: 2017-04-26 | Discharge: 2017-04-26 | Disposition: A | Discharge status: Home / Self Care | Payer: Medicare Other | Attending: Emergency Medicine | Admitting: Emergency Medicine

## 2017-04-26 ENCOUNTER — Emergency Department: Payer: Medicare Other

## 2017-04-26 DIAGNOSIS — W0110XA Fall on same level from slipping, tripping and stumbling with subsequent striking against unspecified object, initial encounter: Secondary | ICD-10-CM | POA: Diagnosis not present

## 2017-04-26 DIAGNOSIS — S0990XA Unspecified injury of head, initial encounter: Secondary | ICD-10-CM | POA: Diagnosis present

## 2017-04-26 DIAGNOSIS — W19XXXA Unspecified fall, initial encounter: Secondary | ICD-10-CM

## 2017-04-26 DIAGNOSIS — F329 Major depressive disorder, single episode, unspecified: Secondary | ICD-10-CM | POA: Insufficient documentation

## 2017-04-26 DIAGNOSIS — Y92129 Unspecified place in nursing home as the place of occurrence of the external cause: Secondary | ICD-10-CM | POA: Insufficient documentation

## 2017-04-26 DIAGNOSIS — Y9389 Activity, other specified: Secondary | ICD-10-CM | POA: Insufficient documentation

## 2017-04-26 DIAGNOSIS — J449 Chronic obstructive pulmonary disease, unspecified: Secondary | ICD-10-CM | POA: Diagnosis not present

## 2017-04-26 DIAGNOSIS — Z87891 Personal history of nicotine dependence: Secondary | ICD-10-CM | POA: Insufficient documentation

## 2017-04-26 DIAGNOSIS — F419 Anxiety disorder, unspecified: Secondary | ICD-10-CM | POA: Insufficient documentation

## 2017-04-26 DIAGNOSIS — F0390 Unspecified dementia without behavioral disturbance: Secondary | ICD-10-CM | POA: Insufficient documentation

## 2017-04-26 DIAGNOSIS — J45909 Unspecified asthma, uncomplicated: Secondary | ICD-10-CM | POA: Insufficient documentation

## 2017-04-26 DIAGNOSIS — Y998 Other external cause status: Secondary | ICD-10-CM | POA: Diagnosis not present

## 2017-04-26 DIAGNOSIS — Z79899 Other long term (current) drug therapy: Secondary | ICD-10-CM | POA: Insufficient documentation

## 2017-04-26 DIAGNOSIS — S81812A Laceration without foreign body, left lower leg, initial encounter: Secondary | ICD-10-CM | POA: Insufficient documentation

## 2017-04-26 LAB — URINALYSIS, COMPLETE (UACMP) WITH MICROSCOPIC
Bacteria, UA: NONE SEEN
Bilirubin Urine: NEGATIVE
Glucose, UA: NEGATIVE mg/dL
Hgb urine dipstick: NEGATIVE
KETONES UR: NEGATIVE mg/dL
Leukocytes, UA: NEGATIVE
Nitrite: NEGATIVE
PROTEIN: NEGATIVE mg/dL
RBC / HPF: NONE SEEN RBC/hpf (ref 0–5)
SQUAMOUS EPITHELIAL / LPF: NONE SEEN
Specific Gravity, Urine: 1.01 (ref 1.005–1.030)
pH: 7 (ref 5.0–8.0)

## 2017-04-26 NOTE — ED Triage Notes (Signed)
Patient to ED from [facility] c/o mechanical fall. Per EMS patient fell backwards and hit her head, and only c/o neck pain but does not react to palpating. No acute distress noted.

## 2017-04-26 NOTE — ED Provider Notes (Signed)
[facility] Emergency Department Provider Note  ____________________________________________  Time seen: Approximately 8:56 PM  I have reviewed the triage vital signs and the nursing notes.   HISTORY  Chief Complaint Fall  Level 5 Caveat: Portions of the History and Physical were unable to be obtained due to altered mental status. due to chronic dementia  HPI Donna Goodman is a 84 y.o. female comes the ED due to a trip and fall at her nursing home memory care. She fell backward and hit her head, witnessed fall. Complains of some neck pain but unable to describe it further. No other complaints. Eating and drinking normally per family, no recent illness or other acute symptoms. At her usual state of health. Currently to me the patient denies any complaints.     Past Medical History:  Diagnosis Date  . Anxiety    unspecified  . Asthma   . Asthma without status asthmaticus    unspecified  . Cataract   . COPD (chronic obstructive pulmonary disease) (HCC)   . Dementia   . Depression    unspecified  . Hyperlipemia    unspecified  . Hypertension   . Thyroid disease   . Visual impairment      Patient Active Problem List   Diagnosis Date Noted  . Hip fracture (HCC) 02/06/2017  . Multifocal pneumonia 01/29/2016  . SOB (shortness of breath) 01/29/2016  . Cough, persistent 01/29/2016  . COPD with acute exacerbation (HCC) 01/29/2016     Past Surgical History:  Procedure Laterality Date  . BACK SURGERY    . HIP PINNING,CANNULATED Right 02/07/2017   Procedure: CANNULATED HIP PINNING;  Surgeon: Lyndle Herrlich, MD;  Location: [facility] ORS;  Service: Orthopedics;  Laterality: Right;     Prior to Admission medications   Medication Sig Start Date End Date Taking? Authorizing Provider  acetaminophen (TYLENOL) 500 MG tablet Take 500 mg by mouth 3 (three) times daily.     [provider]  acetaminophen (TYLENOL) 500 MG tablet Take 500 mg by mouth  every 8 (eight) hours as needed for mild pain, fever or headache.     [provider]  albuterol (PROVENTIL HFA;VENTOLIN HFA) 108 (90 Base) MCG/ACT inhaler Inhale 2 puffs into the lungs every 6 (six) hours as needed for wheezing or shortness of breath. 02/01/16   Katha Hamming, MD  alum & mag hydroxide-simeth (MAALOX/MYLANTA) 200-200-20 MG/5ML suspension Take 30 mLs by mouth 4 (four) times daily as needed for indigestion or heartburn.     [provider]  Amino Acids-Protein Hydrolys (FEEDING SUPPLEMENT, PRO-STAT SUGAR FREE 64,) LIQD Take 30 mLs by mouth 2 (two) times daily between meals.    [provider]  atorvastatin (LIPITOR) 20 MG tablet Take 20 mg by mouth at bedtime.     [provider]  calcium citrate (CALCITRATE - DOSED IN MG ELEMENTAL CALCIUM) 950 MG tablet Take 100 mg of elemental calcium by mouth daily.    [provider]  cetirizine (ZYRTEC) 10 MG tablet Take 10 mg by mouth daily.     [provider]  Cholecalciferol (VITAMIN D) 2000 UNITS CAPS Take 2,000 Units by mouth daily.    [provider]  diclofenac sodium (VOLTAREN) 1 % GEL Apply 2 g topically 2 (two) times daily. To right hip for pain     [provider]  divalproex (DEPAKOTE) 125 MG DR tablet Take 125 mg by mouth 3 (three) times daily. May open capsule and mix granules with  food    [provider]  donepezil (ARICEPT) 5 MG tablet Take 5 mg by mouth at bedtime.     [provider]  enoxaparin (LOVENOX) 40 MG/0.4ML injection Inject 0.4 mLs (40 mg total) into the skin daily. 02/09/17   Shaune Pollack, MD  Fluticasone-Salmeterol (ADVAIR) 100-50 MCG/DOSE AEPB Inhale 1 puff into the lungs 2 (two) times daily.     [provider]  guaiFENesin (ROBITUSSIN) 100 MG/5ML liquid Take 200 mg by mouth every 6 (six) hours as needed for cough.    [provider]  ibuprofen (ADVIL,MOTRIN) 400 MG tablet Take 400 mg by mouth 3 (three)  times daily.    [provider]  levothyroxine (SYNTHROID, LEVOTHROID) 100 MCG tablet Take 100 mcg by mouth daily.     [provider]  loperamide (IMODIUM) 2 MG capsule Take 2 mg by mouth as needed for diarrhea or loose stools.    [provider]  magnesium hydroxide (MILK OF MAGNESIA) 400 MG/5ML suspension Take 30 mLs by mouth at bedtime as needed for mild constipation.    [provider]  Menthol-Methyl Salicylate (MUSCLE RUB) 10-15 % CREA Apply 1 application topically 3 (three) times daily as needed (arm pain).    [provider]  metoprolol succinate (TOPROL-XL) 25 MG 24 hr tablet Take 12.5 mg by mouth daily. Hold for pulse < 60    [provider]  Multiple Vitamin (MULTIVITAMIN WITH MINERALS) TABS tablet Take 1 tablet by mouth daily.    [provider]  neomycin-bacitracin-polymyxin (NEOSPORIN) [phone] ointment Apply 1 application topically as needed (minor skin tears or abrasion).    [provider]  Nutritional Supplements (ENSURE ENLIVE PO) Take 1 Bottle by mouth 2 (two) times daily between meals.    [provider]  Probiotic Product (RISA-BID PROBIOTIC) TABS Take 1 tablet by mouth daily.    [provider]  sertraline (ZOLOFT) 50 MG tablet Take 50 mg by mouth daily.     [provider]  topiramate (TOPAMAX) 25 MG tablet Take 25 mg by mouth at bedtime.     [provider]     Allergies Codeine   Family History  Problem Relation Age of Onset  . Coronary artery disease Mother   . Heart attack Mother   . Arthritis Other   . Cancer Other   . Cancer Father   . Cancer Sister   . Cancer Brother     Social History Social History   Tobacco Use  . Smoking status: Former Smoker    Packs/day: 0.50    Years: 65.00    Pack years: 32.50    Types: Cigarettes  . Smokeless tobacco: Never Used  Substance Use Topics  . Alcohol use: No    Comment: hx of beer and wine  . Drug  use: No    Review of Systems  Constitutional:   No fever or chills.  ENT:   No sore throat. No rhinorrhea. Cardiovascular:   No chest pain. Respiratory:   No dyspnea or cough. Gastrointestinal:   no vomiting and diarrhea.  Musculoskeletal:   Negative for focal pain or swelling All other systems reviewed and are negative except as documented above in ROS and HPI.  ____________________________________________   PHYSICAL EXAM:  VITAL SIGNS: ED Triage Vitals  Enc Vitals Group     BP 04/26/17 1906 (!) 168/108     Pulse Rate 04/26/17 1906 (!) 52     Resp 04/26/17 1906 18  Temp 04/26/17 1906 97.7 F (36.5 C)     Temp Source 04/26/17 1906 Oral     SpO2 04/26/17 1906 100 %     Weight 04/26/17 1907 120 lb (54.4 kg)     Height 04/26/17 1907 5\' 2"  (1.575 m)     Head Circumference --      Peak Flow --      Pain Score --      Pain Loc --      Pain Edu? --      Excl. in GC? --     Vital signs reviewed, nursing assessments reviewed.   Constitutional:   Alert and oriented to self. Well appearing and in no distress. Eyes:   No scleral icterus.  EOMI. No nystagmus. No conjunctival pallor. PERRL. ENT   Head:   Normocephalic and atraumatic.   Nose:   No congestion/rhinnorhea.    Mouth/Throat:   MMM, no pharyngeal erythema. No peritonsillar mass.    Neck:   No meningismus. Full ROM. Hematological/Lymphatic/Immunilogical:   No cervical lymphadenopathy. Cardiovascular:   RRR. Symmetric bilateral radial and DP pulses.  No murmurs.  Respiratory:   Normal respiratory effort without tachypnea/retractions. Breath sounds are clear and equal bilaterally. No wheezes/rales/rhonchi. Gastrointestinal:   Soft and nontender. Non distended. There is no CVA tenderness.  No rebound, rigidity, or guarding. Genitourinary:   deferred Musculoskeletal:   Normal range of motion in all extremities. No joint effusions.  No lower extremity tenderness.  No edema. Neurologic:   Normal speech and  language.  Motor grossly intact. No acute focal neurologic deficits are appreciated.  Skin:    Skin is warm, dry with a 1 cm superficial skin tear on the left shin. Hemostatic. No laceration..  ____________________________________________    LABS (pertinent positives/negatives) (all labs ordered are listed, but only abnormal results are displayed) Labs Reviewed  URINALYSIS, COMPLETE (UACMP) WITH MICROSCOPIC - Abnormal; Notable for the following components:      Result Value   Color, Urine STRAW (*)    APPearance CLEAR (*)    All other components within normal limits  URINE CULTURE   ____________________________________________   EKG  interpreted by me Sinus bradycardia rate of 54, normal axis and intervals. Normal QRS ST segments and T waves. No acute ischemic changes. No evidence of underlying electrolyte abnormality  ____________________________________________    RADIOLOGY  Ct Head Wo Contrast  Result Date: 04/26/2017 CLINICAL DATA:  84 year old female with acute head and neck injury following fall today. Headache and neck pain. EXAM: CT HEAD WITHOUT CONTRAST CT CERVICAL SPINE WITHOUT CONTRAST TECHNIQUE: Multidetector CT imaging of the head and cervical spine was performed following the standard protocol without intravenous contrast. Multiplanar CT image reconstructions of the cervical spine were also generated. COMPARISON:  10/17/2016 and prior CTs FINDINGS: CT HEAD FINDINGS Brain: No evidence of acute infarction, hemorrhage, hydrocephalus, extra-axial collection or mass lesion/mass effect. Atrophy and chronic small-vessel white matter ischemic changes again noted. Vascular: Atherosclerotic calcifications again noted. Skull: Normal. Negative for fracture or focal lesion. Sinuses/Orbits: No acute finding. Other: None CT CERVICAL SPINE FINDINGS Alignment: Normal. Skull base and vertebrae: No acute fracture. No primary bone lesion or focal pathologic process. Soft tissues and spinal  canal: No prevertebral fluid or swelling. No visible canal hematoma. Disc levels: Mild-to-moderate degenerative disc disease, spondylosis and facet arthropathy from C4-C7 again noted. Upper chest: Negative. Other: None IMPRESSION: 1. No evidence of acute intracranial abnormality. Atrophy and chronic small-vessel white matter ischemic changes. 2. No static evidence of  acute injury to the cervical spine. Degenerative changes again noted. Electronically Signed   By: Harmon Pier M.D.   On: 04/26/2017 20:30   Ct Cervical Spine Wo Contrast  Result Date: 04/26/2017 CLINICAL DATA:  84 year old female with acute head and neck injury following fall today. Headache and neck pain. EXAM: CT HEAD WITHOUT CONTRAST CT CERVICAL SPINE WITHOUT CONTRAST TECHNIQUE: Multidetector CT imaging of the head and cervical spine was performed following the standard protocol without intravenous contrast. Multiplanar CT image reconstructions of the cervical spine were also generated. COMPARISON:  10/17/2016 and prior CTs FINDINGS: CT HEAD FINDINGS Brain: No evidence of acute infarction, hemorrhage, hydrocephalus, extra-axial collection or mass lesion/mass effect. Atrophy and chronic small-vessel white matter ischemic changes again noted. Vascular: Atherosclerotic calcifications again noted. Skull: Normal. Negative for fracture or focal lesion. Sinuses/Orbits: No acute finding. Other: None CT CERVICAL SPINE FINDINGS Alignment: Normal. Skull base and vertebrae: No acute fracture. No primary bone lesion or focal pathologic process. Soft tissues and spinal canal: No prevertebral fluid or swelling. No visible canal hematoma. Disc levels: Mild-to-moderate degenerative disc disease, spondylosis and facet arthropathy from C4-C7 again noted. Upper chest: Negative. Other: None IMPRESSION: 1. No evidence of acute intracranial abnormality. Atrophy and chronic small-vessel white matter ischemic changes. 2. No static evidence of acute injury to the cervical  spine. Degenerative changes again noted. Electronically Signed   By: Harmon Pier M.D.   On: 04/26/2017 20:30    ____________________________________________   PROCEDURES Procedures  ____________________________________________    CLINICAL IMPRESSION / ASSESSMENT AND PLAN / ED COURSE  Pertinent labs & imaging results that were available during my care of the patient were reviewed by me and considered in my medical decision making (see chart for details).   . Well-appearing no acute distress, unremarkable vital signs, and apparent baseline mental status and state of health. Had a witnessed mechanical fall today. CT scan is negative for intracranial hemorrhage C-spine fracture or skull fracture. No evidence of sepsis meningitis encephalitis and pneumonia or urinary tract infection. Urinalysis is unremarkable. Suitable for discharge home and continue follow-up with her primary care doctor.      ____________________________________________   FINAL CLINICAL IMPRESSION(S) / ED DIAGNOSES    Final diagnoses:  Fall, initial encounter  Noninfected skin tear of lower extremity, left, initial encounter     ED Discharge Orders    None      Portions of this note were generated with dragon dictation software. Dictation errors may occur despite best attempts at proofreading.    Sharman Cheek, MD 04/26/17 2100

## 2017-04-26 NOTE — Discharge Instructions (Signed)
your CT scan of the head and neck were unremarkable. Urine test did not show any infection.

## 2017-04-28 LAB — URINE CULTURE: CULTURE: NO GROWTH

## 2017-09-09 IMAGING — CR DG CHEST 2V
2 series · 2 of 2 positions shown · non-contrast
Comparison: 04/29/2014 and 03/14/2013 chest radiographs

CLINICAL DATA: 88 y/o F; sepsis, unwitnessed fall, confusion, and
poor historian.

EXAM:
CHEST  2 VIEW

[chest pa]
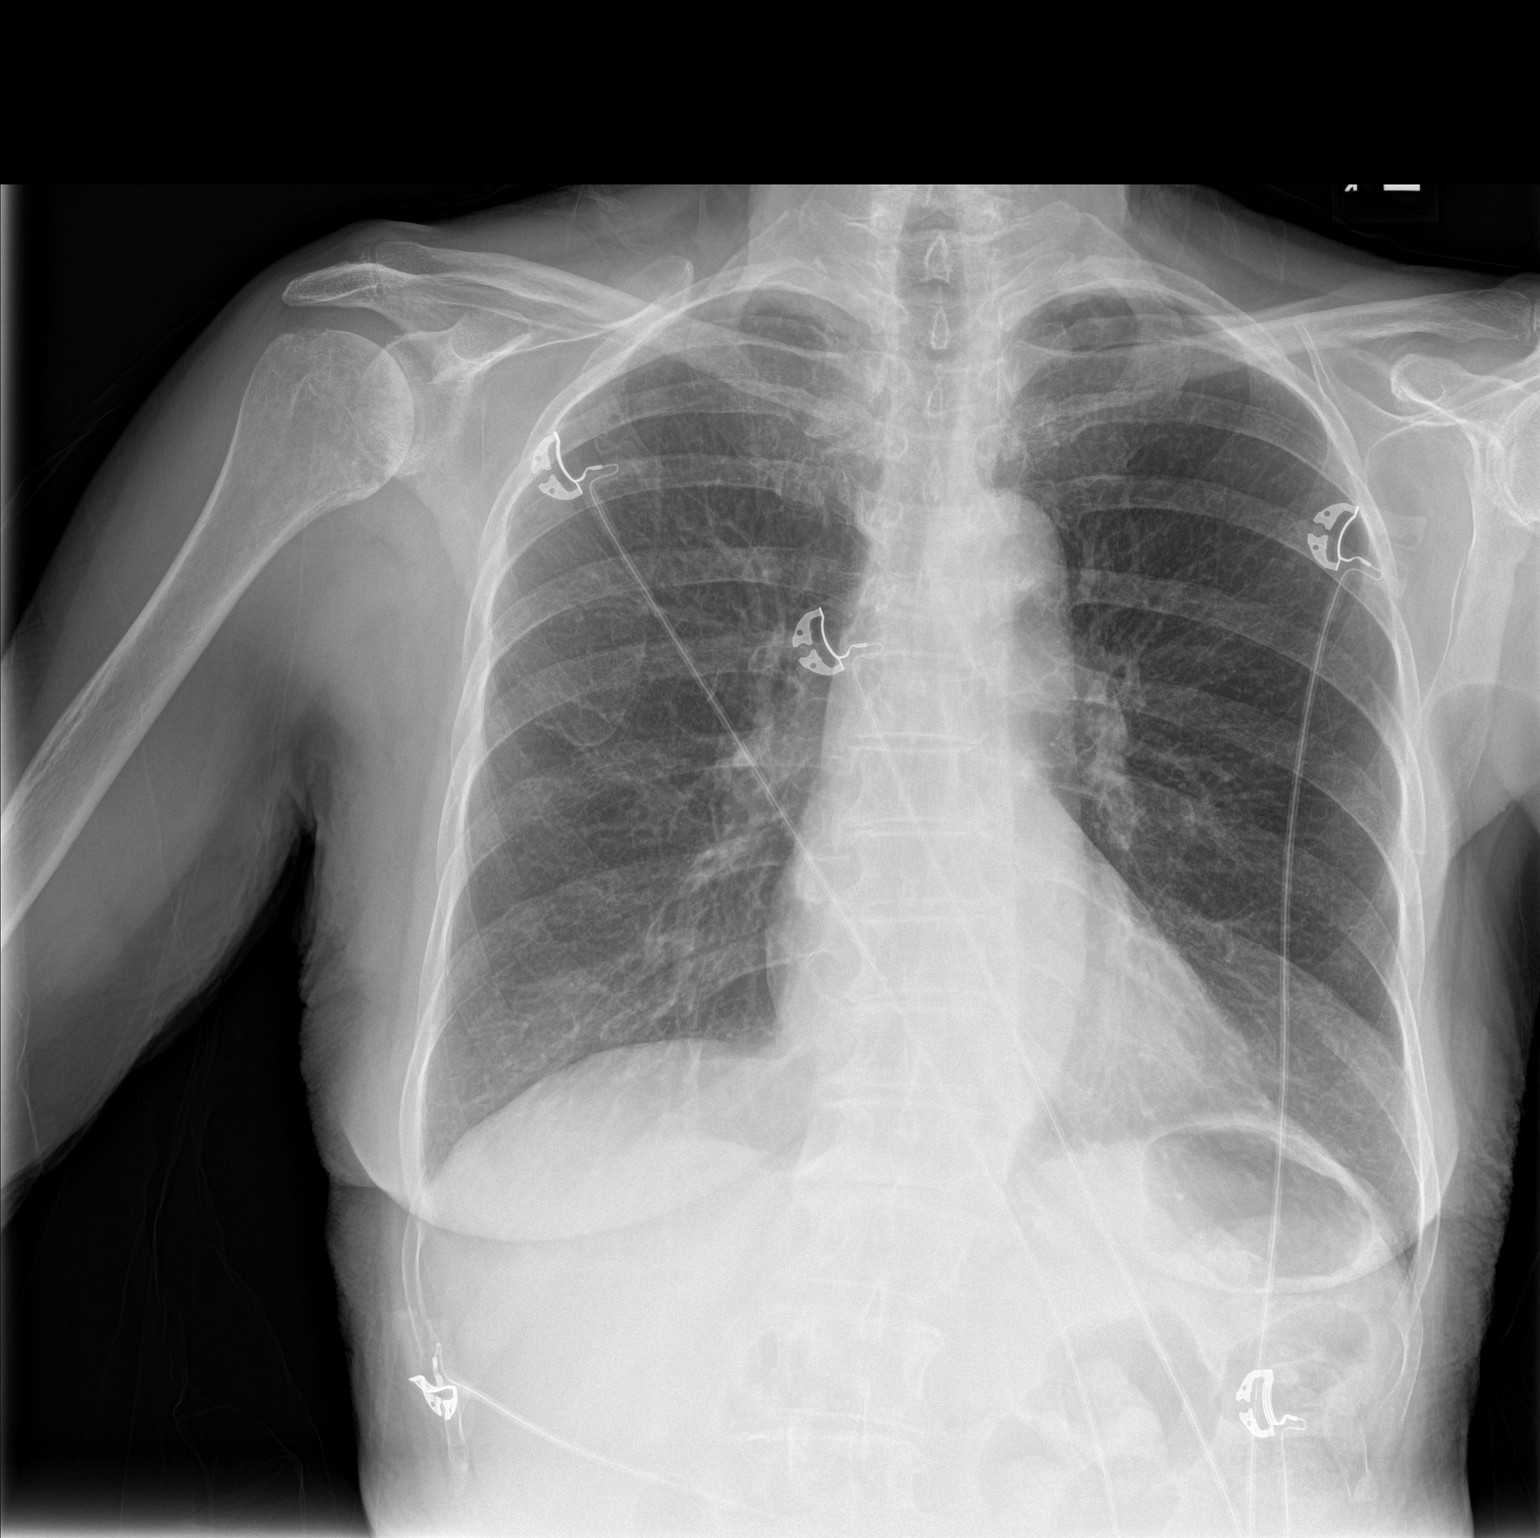

[chest lat]
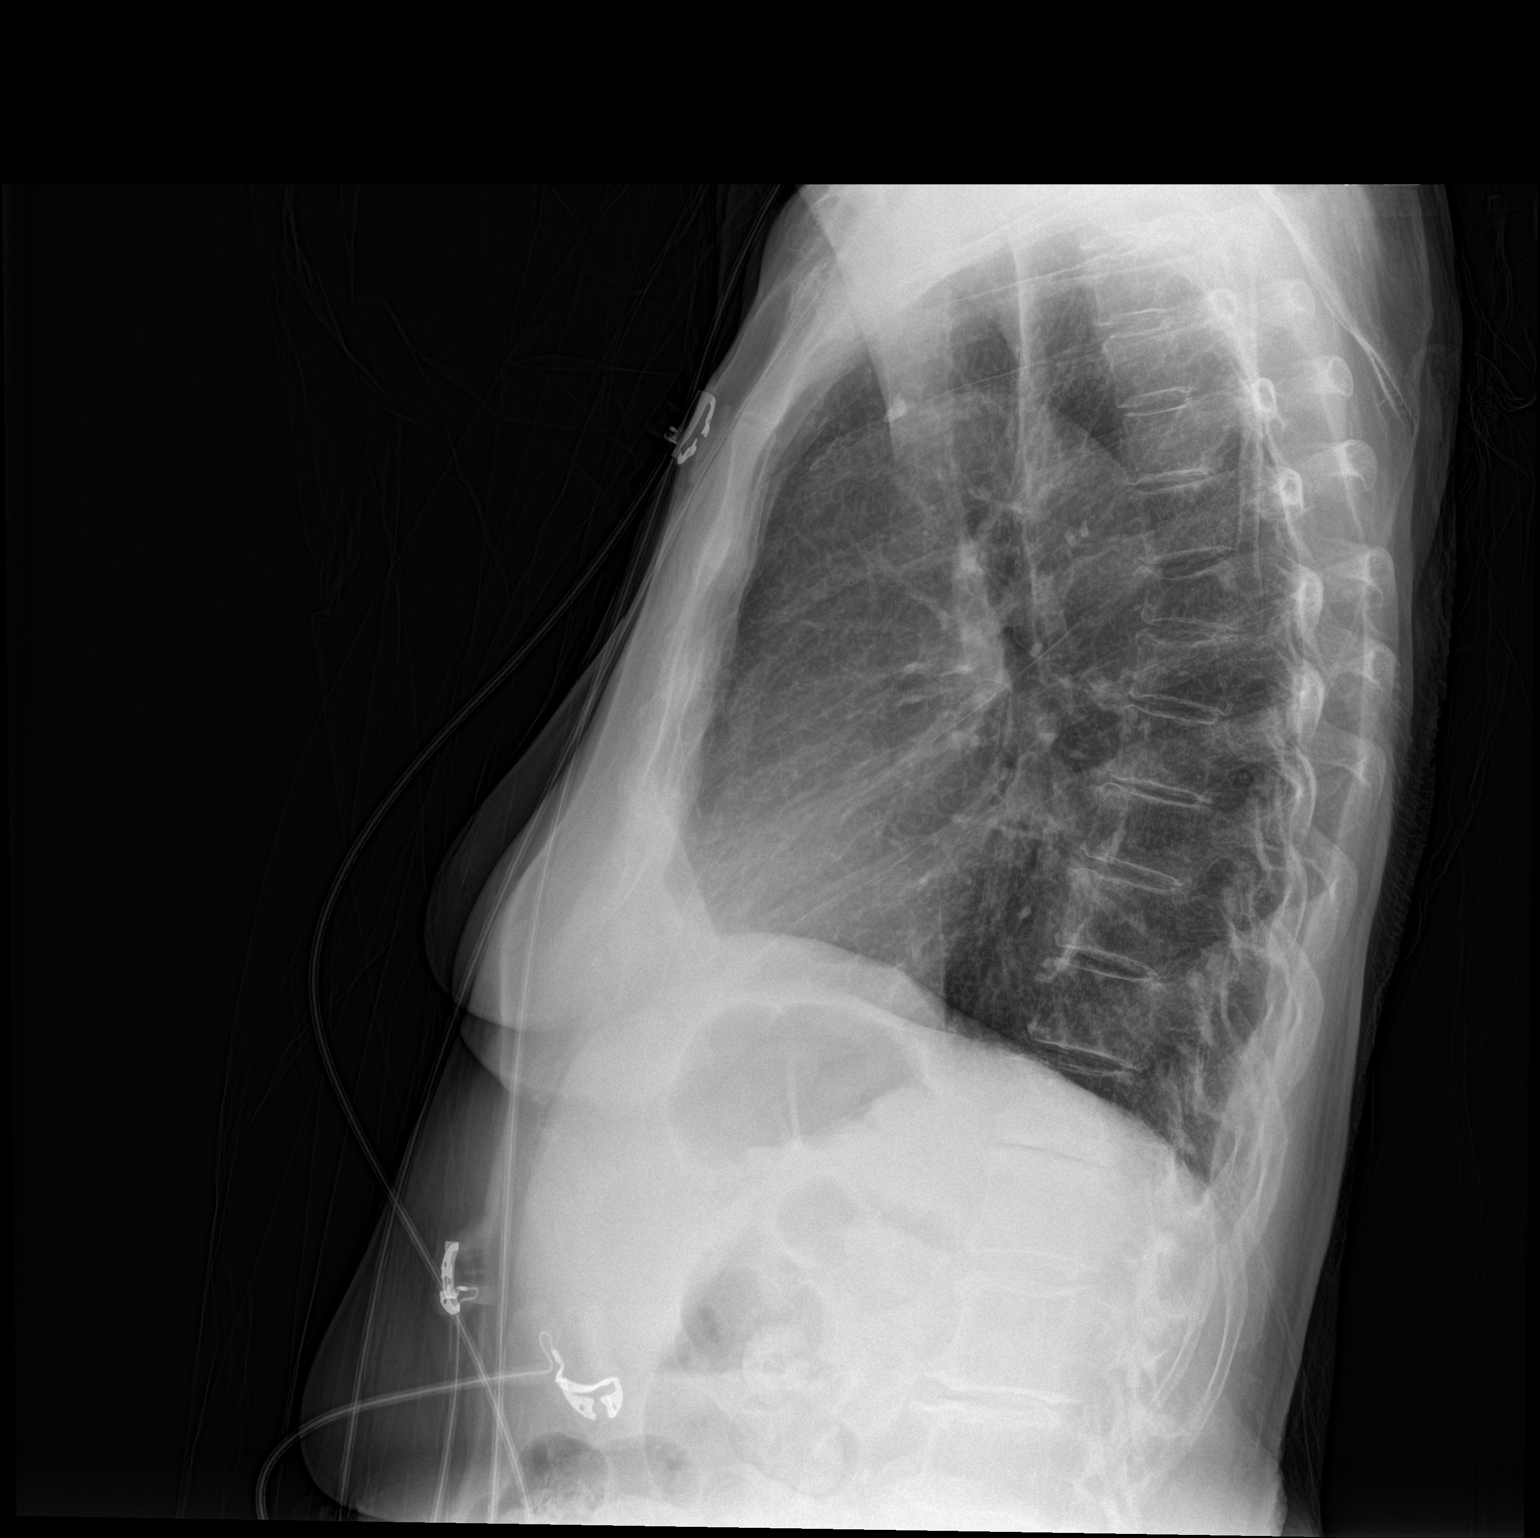

[2 of 2 positions shown; findings below may reference images not displayed]

FINDINGS: The heart size and mediastinal contours are within normal limits and
stable. Both lungs are clear. Age-indeterminate mild anterior
compression deformity of mid thoracic vertebral body.
IMPRESSION: No active cardiopulmonary disease.

Age-indeterminate mild anterior compression deformity of mid
thoracic vertebral body, approximately T6.

By: Maitri Diehl M.D.

## 2017-09-09 IMAGING — CT CT HEAD W/O CM
3 of 4 series · 16 of 47 positions shown, 19 images · non-contrast
Comparison: Head CT 01/21/2015

CLINICAL DATA: Weakness, unwitnessed fall.

EXAM:
CT HEAD WITHOUT CONTRAST
TECHNIQUE: Contiguous axial images were obtained from the base of the skull
through the vertex without intravenous contrast.

[Series 2: head wo · axial · 0.42mm/px · z∈[-107,+13]mm · 10 of 30 slices shown, 13 images]
[im 3/30  brain]
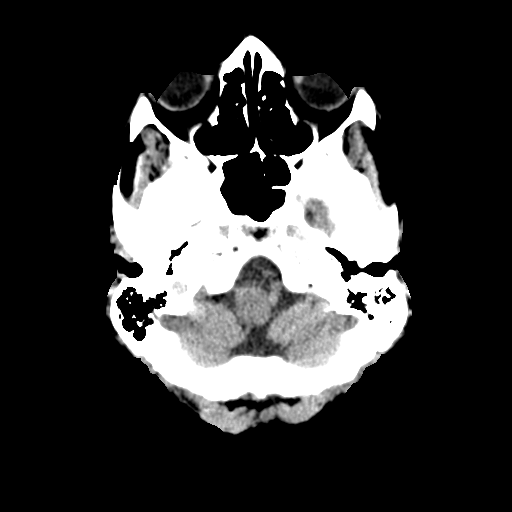
[im 3/30  bone]
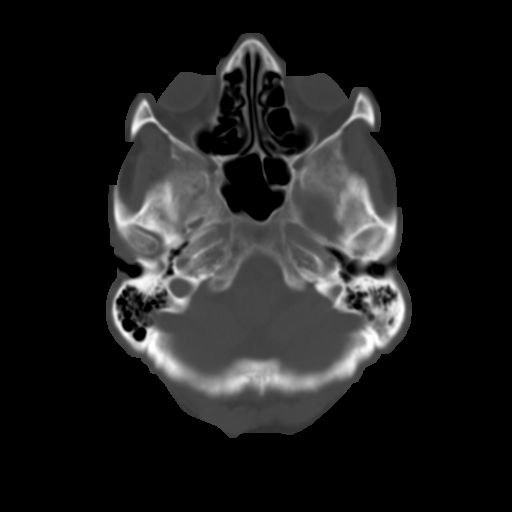
[im 5/30  brain]
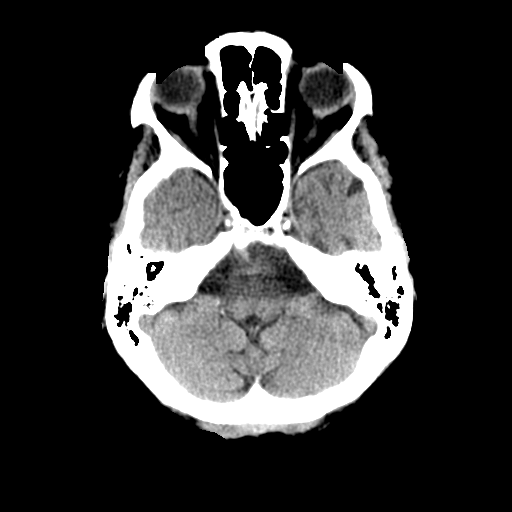
[im 8/30  brain]
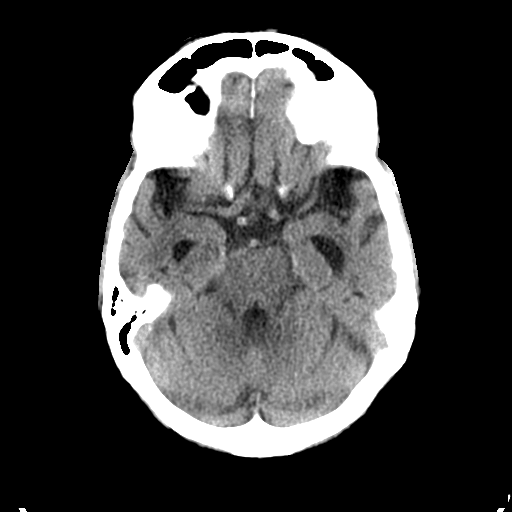
[im 11/30  brain]
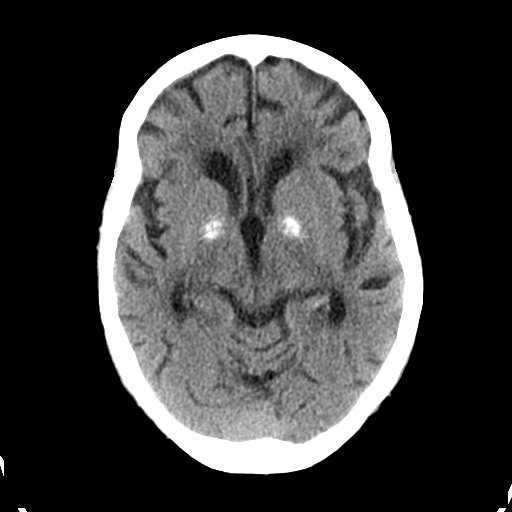
[im 14/30  brain]
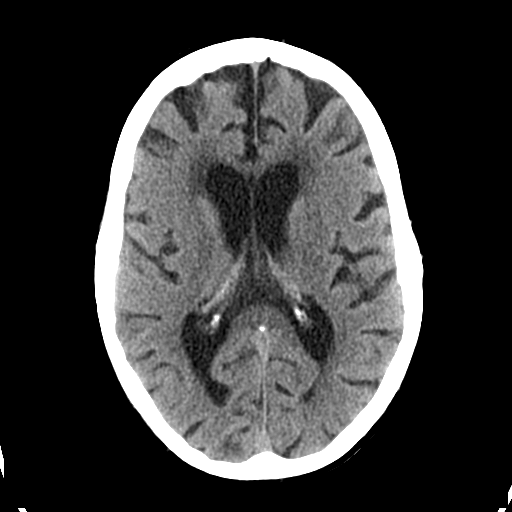
[im 14/30  bone]
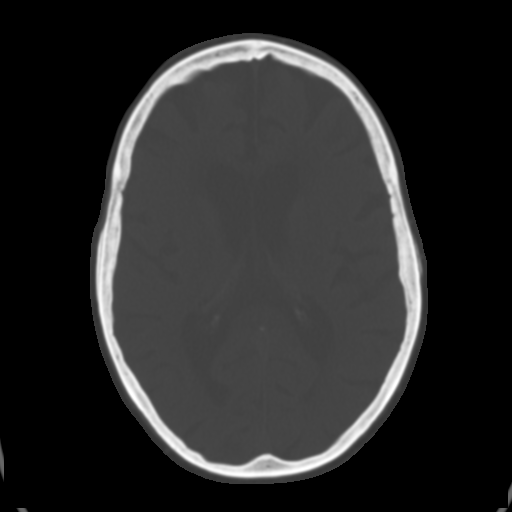
[im 16/30  brain]
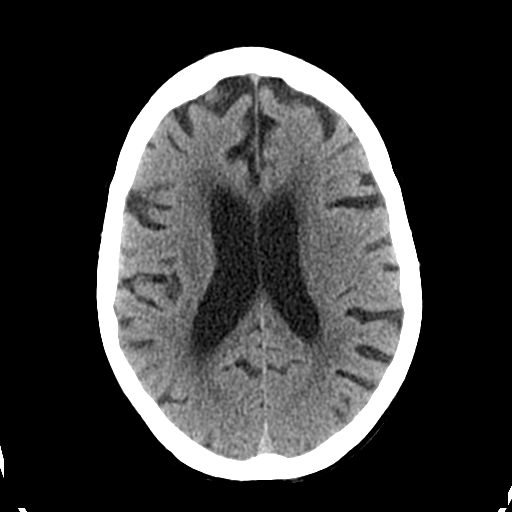
[im 19/30  brain]
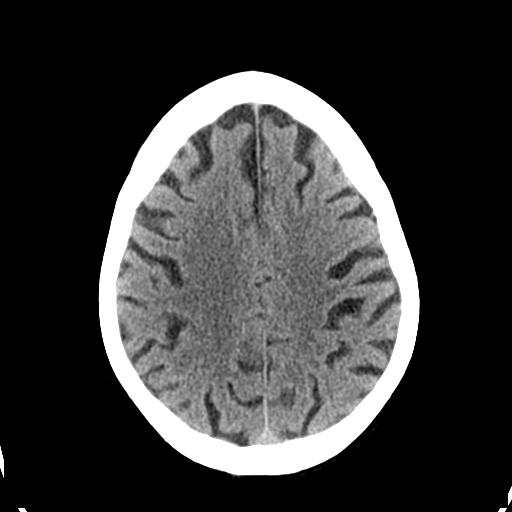
[im 22/30  brain]
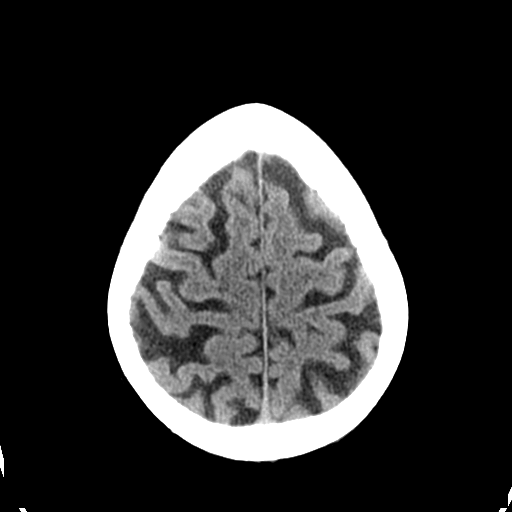
[im 25/30  brain]
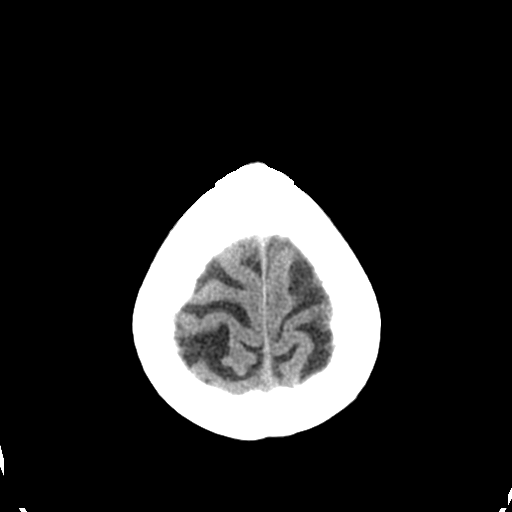
[im 25/30  bone]
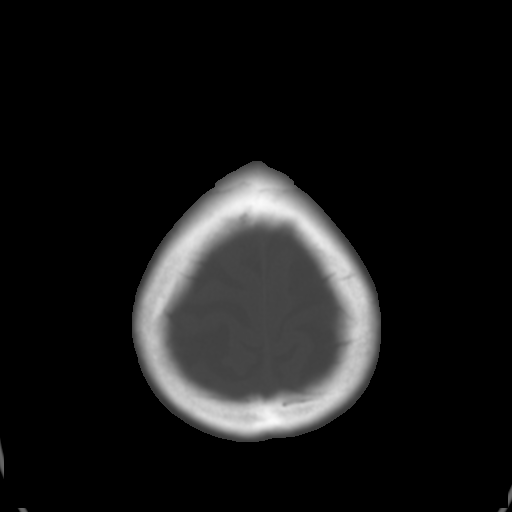
[im 27/30  brain]
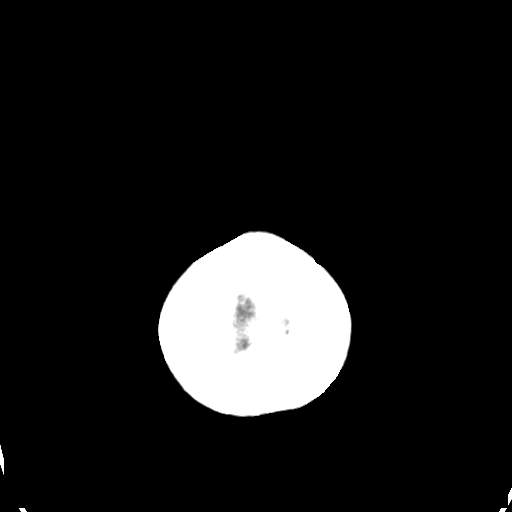

[Series 4: coronal soft tissue · coronal · 0.30mm/px · 3 of 59 slices shown]
[im 20/59  brain]
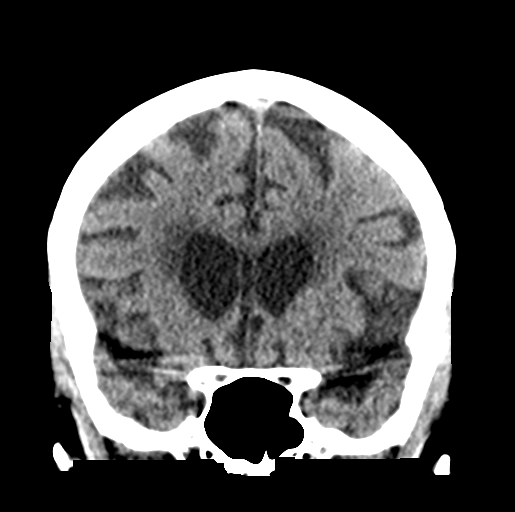
[im 26/59  brain]
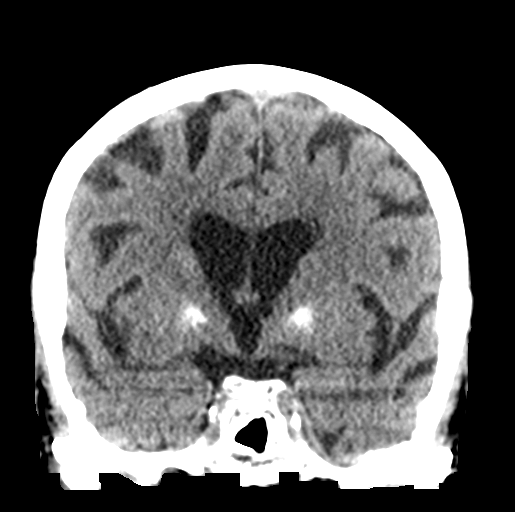
[im 33/59  brain]
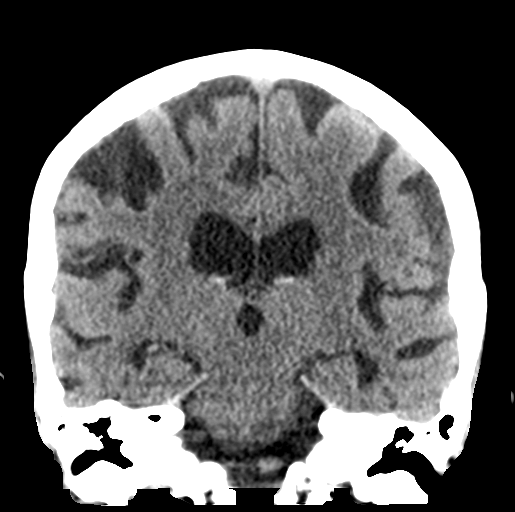

[Series 5: sagittal soft tissue · sagittal · 0.30mm/px · 3 of 43 slices shown]
[im 15/43  brain]
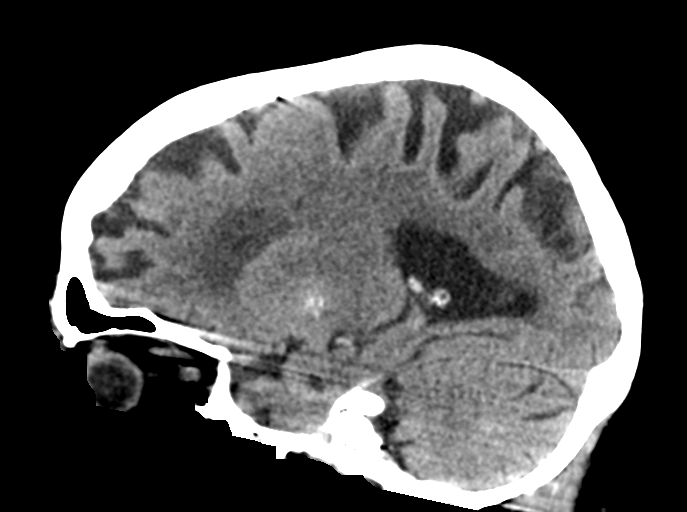
[im 22/43  brain]
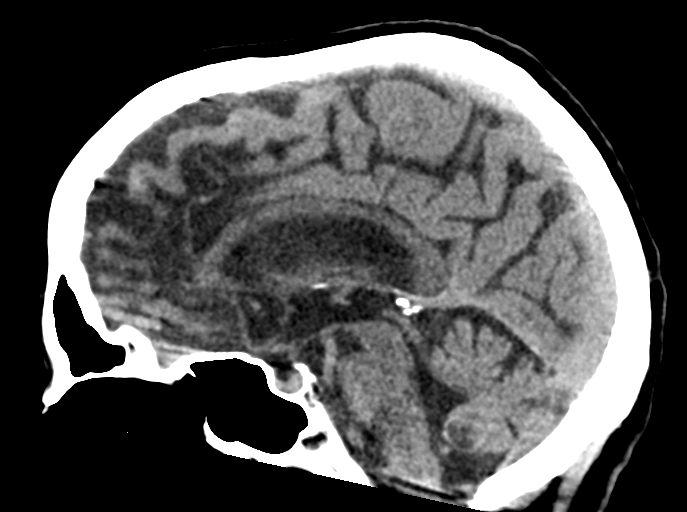
[im 29/43  brain]
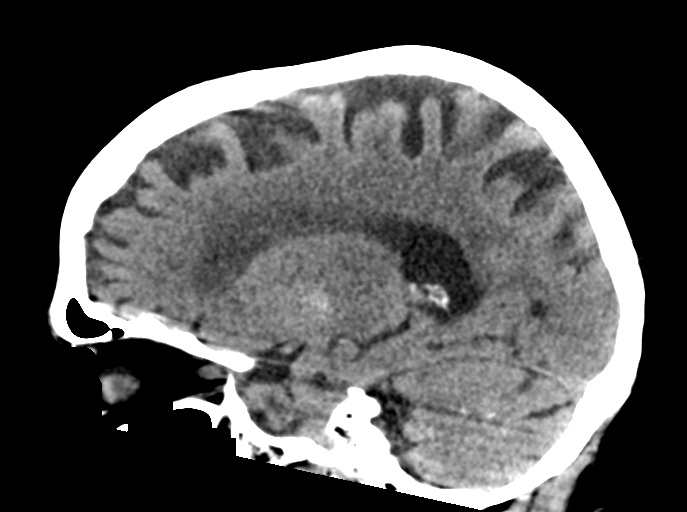

[16 of 47 positions shown; findings below may reference images not displayed]

FINDINGS: Brain: No evidence of acute infarction, hemorrhage, hydrocephalus,
extra-axial collection or mass lesion/mass effect. Stable atrophy
and chronic small vessel ischemia. Stable basal gangliar and
cerebellar calcifications.

Vascular: Atherosclerosis of skullbase vasculature, no hyperdense
vessel.

Skull: Normal. Negative for fracture or focal lesion.

Sinuses/Orbits: No acute finding.

Other: None.
IMPRESSION: No acute intracranial abnormality. Stable atrophy and chronic small
vessel ischemia.

## 2018-06-26 ENCOUNTER — Other Ambulatory Visit: Payer: Self-pay

## 2018-06-26 ENCOUNTER — Emergency Department: Payer: Medicare Other

## 2018-06-26 ENCOUNTER — Emergency Department
Admission: EM | Admit: 2018-06-26 | Discharge: 2018-06-26 | Disposition: A | Discharge status: Nursing Home (Includes ICF) | Payer: Medicare Other | Attending: Student in an Organized Health Care Education/Training Program | Admitting: Student in an Organized Health Care Education/Training Program

## 2018-06-26 DIAGNOSIS — R4182 Altered mental status, unspecified: Secondary | ICD-10-CM | POA: Diagnosis present

## 2018-06-26 DIAGNOSIS — J449 Chronic obstructive pulmonary disease, unspecified: Secondary | ICD-10-CM | POA: Diagnosis not present

## 2018-06-26 DIAGNOSIS — Z87891 Personal history of nicotine dependence: Secondary | ICD-10-CM | POA: Diagnosis not present

## 2018-06-26 DIAGNOSIS — J45909 Unspecified asthma, uncomplicated: Secondary | ICD-10-CM | POA: Diagnosis not present

## 2018-06-26 DIAGNOSIS — Z79899 Other long term (current) drug therapy: Secondary | ICD-10-CM | POA: Diagnosis not present

## 2018-06-26 DIAGNOSIS — Z1159 Encounter for screening for other viral diseases: Secondary | ICD-10-CM | POA: Diagnosis not present

## 2018-06-26 DIAGNOSIS — I10 Essential (primary) hypertension: Secondary | ICD-10-CM | POA: Diagnosis not present

## 2018-06-26 LAB — COMPREHENSIVE METABOLIC PANEL
ALT: 18 U/L (ref 0–44)
AST: 23 U/L (ref 15–41)
Albumin: 3.8 g/dL (ref 3.5–5.0)
Alkaline Phosphatase: 86 U/L (ref 38–126)
Anion gap: 10 (ref 5–15)
BUN: 31 mg/dL — ABNORMAL HIGH (ref 8–23)
CO2: 24 mmol/L (ref 22–32)
Calcium: 9 mg/dL (ref 8.9–10.3)
Chloride: 107 mmol/L (ref 98–111)
Creatinine, Ser: 0.54 mg/dL (ref 0.44–1.00)
GFR calc Af Amer: >60 mL/min (ref >60)
GFR calc non Af Amer: >60 mL/min (ref >60)
Glucose, Bld: 120 mg/dL — ABNORMAL HIGH (ref 70–99)
Potassium: 3.6 mmol/L (ref 3.5–5.1)
Sodium: 141 mmol/L (ref 135–145)
Total Bilirubin: 0.5 mg/dL (ref 0.3–1.2)
Total Protein: 7.1 g/dL (ref 6.5–8.1)

## 2018-06-26 LAB — CBC WITH DIFFERENTIAL/PLATELET
Abs Immature Granulocytes: 0.01 10*3/uL (ref 0.00–0.07)
Basophils Absolute: 0 10*3/uL (ref 0.0–0.1)
Basophils Relative: 1 %
Eosinophils Absolute: 0.2 10*3/uL (ref 0.0–0.5)
Eosinophils Relative: 3 %
HCT: 35.5 % — ABNORMAL LOW (ref 36.0–46.0)
Hemoglobin: 11.1 g/dL — ABNORMAL LOW (ref 12.0–15.0)
Immature Granulocytes: 0 %
Lymphocytes Relative: 19 %
Lymphs Abs: 1.5 10*3/uL (ref 0.7–4.0)
MCH: 31.7 pg (ref 26.0–34.0)
MCHC: 31.3 g/dL (ref 30.0–36.0)
MCV: 101.4 fL — ABNORMAL HIGH (ref 80.0–100.0)
Monocytes Absolute: 0.8 10*3/uL (ref 0.1–1.0)
Monocytes Relative: 10 %
Neutro Abs: 5.5 10*3/uL (ref 1.7–7.7)
Neutrophils Relative %: 67 %
Platelets: 234 10*3/uL (ref 150–400)
RBC: 3.5 MIL/uL — ABNORMAL LOW (ref 3.87–5.11)
RDW: 13.2 % (ref 11.5–15.5)
WBC: 8 10*3/uL (ref 4.0–10.5)
nRBC: 0 % (ref 0.0–0.2)

## 2018-06-26 LAB — URINALYSIS, COMPLETE (UACMP) WITH MICROSCOPIC
Bacteria, UA: NONE SEEN
Bilirubin Urine: NEGATIVE
Glucose, UA: NEGATIVE mg/dL
Hgb urine dipstick: NEGATIVE
Ketones, ur: NEGATIVE mg/dL
Leukocytes,Ua: NEGATIVE
Nitrite: NEGATIVE
Protein, ur: NEGATIVE mg/dL
Specific Gravity, Urine: 1.024 (ref 1.005–1.030)
Squamous Epithelial / HPF: NONE SEEN (ref 0–5)
pH: 6 (ref 5.0–8.0)

## 2018-06-26 LAB — SARS CORONAVIRUS 2 BY RT PCR (HOSPITAL ORDER, PERFORMED IN ~~LOC~~ HOSPITAL LAB): SARS Coronavirus 2: NEGATIVE

## 2018-06-26 LAB — LACTIC ACID, PLASMA: Lactic Acid, Venous: 1.1 mmol/L (ref 0.5–1.9)

## 2018-06-26 LAB — ABO/RH: ABO/RH(D): A NEG

## 2018-06-26 NOTE — ED Notes (Signed)
Attempted to call  House [phone] to give report, no answer. Will attempt again

## 2018-06-26 NOTE — ED Triage Notes (Signed)
Pt here via EMS from [facility]. Per EMS pt's facility doctor had her come to be evaluated for AMS and weakness since Saturday. Pt follows commands but sometimes it is hard to get patient to answer questions.

## 2018-06-26 NOTE — ED Provider Notes (Signed)
[facility] Emergency Department Provider Note    First MD Initiated Contact with Patient 06/26/18 1105     (approximate)  I have reviewed the triage vital signs and the nursing notes.   HISTORY  Chief Complaint Altered Mental Status  Level V caveat:  AMS  HPI Donna Goodman is a 85 y.o. female plosive past medical history presenting from [facility] for weakness and confusion since Saturday.  Sent to the ER for further evaluation.  Patient uncooperative with staff.  Unable to provide any additional history.  Denies any pain but will not give any history as to why she is here.  Denies any headache.  Denies any pain.  No shortness of breath.  Facility was concerned for UTI.    Past Medical History:  Diagnosis Date  . Anxiety    unspecified  . Asthma   . Asthma without status asthmaticus    unspecified  . Cataract   . COPD (chronic obstructive pulmonary disease) (HCC)   . Dementia (HCC)   . Depression    unspecified  . Hyperlipemia    unspecified  . Hypertension   . Thyroid disease   . Visual impairment    Family History  Problem Relation Age of Onset  . Coronary artery disease Mother   . Heart attack Mother   . Arthritis Other   . Cancer Other   . Cancer Father   . Cancer Sister   . Cancer Brother    Past Surgical History:  Procedure Laterality Date  . BACK SURGERY    . HIP PINNING,CANNULATED Right 02/07/2017   Procedure: CANNULATED HIP PINNING;  Surgeon: Lyndle Herrlich, MD;  Location: [facility] ORS;  Service: Orthopedics;  Laterality: Right;   Patient Active Problem List   Diagnosis Date Noted  . Hip fracture (HCC) 02/06/2017  . Multifocal pneumonia 01/29/2016  . SOB (shortness of breath) 01/29/2016  . Cough, persistent 01/29/2016  . COPD with acute exacerbation (HCC) 01/29/2016      Prior to Admission medications   Medication Sig Start Date End Date Taking? Authorizing Provider  acetaminophen (TYLENOL) 500 MG tablet Take 500  mg by mouth 3 (three) times daily.     [provider]  acetaminophen (TYLENOL) 500 MG tablet Take 500 mg by mouth every 8 (eight) hours as needed for mild pain, fever or headache.     [provider]  albuterol (PROVENTIL HFA;VENTOLIN HFA) 108 (90 Base) MCG/ACT inhaler Inhale 2 puffs into the lungs every 6 (six) hours as needed for wheezing or shortness of breath. 02/01/16   Katha Hamming, MD  alum & mag hydroxide-simeth (MAALOX/MYLANTA) 200-200-20 MG/5ML suspension Take 30 mLs by mouth 4 (four) times daily as needed for indigestion or heartburn.     [provider]  Amino Acids-Protein Hydrolys (FEEDING SUPPLEMENT, PRO-STAT SUGAR FREE 64,) LIQD Take 30 mLs by mouth 2 (two) times daily between meals.    [provider]  atorvastatin (LIPITOR) 20 MG tablet Take 20 mg by mouth at bedtime.     [provider]  calcium citrate (CALCITRATE - DOSED IN MG ELEMENTAL CALCIUM) 950 MG tablet Take 100 mg of elemental calcium by mouth daily.    [provider]  cetirizine (ZYRTEC) 10 MG tablet Take 10 mg by mouth daily.     [provider]  Cholecalciferol (VITAMIN D) 2000 UNITS CAPS Take 2,000 Units by mouth daily.    [provider]  diclofenac sodium (VOLTAREN) 1 % GEL Apply  2 g topically 2 (two) times daily. To right hip for pain     [provider]  divalproex (DEPAKOTE) 125 MG DR tablet Take 125 mg by mouth 3 (three) times daily. May open capsule and mix granules with food    [provider]  donepezil (ARICEPT) 5 MG tablet Take 5 mg by mouth at bedtime.     [provider]  enoxaparin (LOVENOX) 40 MG/0.4ML injection Inject 0.4 mLs (40 mg total) into the skin daily. 02/09/17   Shaune Pollack, MD  Fluticasone-Salmeterol (ADVAIR) 100-50 MCG/DOSE AEPB Inhale 1 puff into the lungs 2 (two) times daily.     [provider]  guaiFENesin (ROBITUSSIN) 100 MG/5ML liquid Take 200 mg by mouth every 6 (six)  hours as needed for cough.    [provider]  ibuprofen (ADVIL,MOTRIN) 400 MG tablet Take 400 mg by mouth 3 (three) times daily.    [provider]  levothyroxine (SYNTHROID, LEVOTHROID) 100 MCG tablet Take 100 mcg by mouth daily.     [provider]  loperamide (IMODIUM) 2 MG capsule Take 2 mg by mouth as needed for diarrhea or loose stools.    [provider]  magnesium hydroxide (MILK OF MAGNESIA) 400 MG/5ML suspension Take 30 mLs by mouth at bedtime as needed for mild constipation.    [provider]  Menthol-Methyl Salicylate (MUSCLE RUB) 10-15 % CREA Apply 1 application topically 3 (three) times daily as needed (arm pain).    [provider]  metoprolol succinate (TOPROL-XL) 25 MG 24 hr tablet Take 12.5 mg by mouth daily. Hold for pulse < 60    [provider]  Multiple Vitamin (MULTIVITAMIN WITH MINERALS) TABS tablet Take 1 tablet by mouth daily.    [provider]  neomycin-bacitracin-polymyxin (NEOSPORIN) [phone] ointment Apply 1 application topically as needed (minor skin tears or abrasion).    [provider]  Nutritional Supplements (ENSURE ENLIVE PO) Take 1 Bottle by mouth 2 (two) times daily between meals.    [provider]  Probiotic Product (RISA-BID PROBIOTIC) TABS Take 1 tablet by mouth daily.    [provider]  sertraline (ZOLOFT) 50 MG tablet Take 50 mg by mouth daily.     [provider]  topiramate (TOPAMAX) 25 MG tablet Take 25 mg by mouth at bedtime.     [provider]    Allergies Codeine    Social History Social History   Tobacco Use  . Smoking status: Former Smoker    Packs/day: 0.50    Years: 65.00    Pack years: 32.50    Types: Cigarettes  . Smokeless tobacco: Never Used  Substance Use Topics  . Alcohol use: No    Comment: hx of beer and wine  . Drug use: No    Review of Systems Patient denies headaches, rhinorrhea, blurry  vision, numbness, shortness of breath, chest pain, edema, cough, abdominal pain, nausea, vomiting, diarrhea, dysuria, fevers, rashes or hallucinations unless otherwise stated above in HPI. ____________________________________________   PHYSICAL EXAM:  VITAL SIGNS: Vitals:   06/26/18 1109  BP: (!) 146/79  Pulse: 73  Temp: (!) 96.6 F (35.9 C)  SpO2: 95%    Constitutional: Alert disoriented x 3 Eyes: Conjunctivae are normal.  Head: Atraumatic. Nose: No congestion/rhinnorhea. Mouth/Throat: Mucous membranes are moist.   Neck: No stridor. Painless ROM.  Cardiovascular: Normal rate, regular rhythm. Grossly normal heart sounds.  Good peripheral circulation. Respiratory: Normal respiratory effort.  No retractions. Lungs CTAB. Gastrointestinal: Soft  and nontender. No distention. No abdominal bruits. No CVA tenderness. Genitourinary: deferred Musculoskeletal: No lower extremity tenderness nor edema.  No joint effusions. Neurologic:  Normal speech and language. No gross focal neurologic deficits are appreciated. No facial droop Skin:  Skin is warm, dry and intact. No rash noted. Psychiatric: agitated and uncooperative with healthcare staff  ____________________________________________   LABS (all labs ordered are listed, but only abnormal results are displayed)  No results found for this or any previous visit (from the past 24 hour(s)). ____________________________________________  EKG My review and personal interpretation at Time: 11:37   Indication: ams  Rate: 85  Rhythm: sinus Axis: normal Other: normal intervals, no stemi ____________________________________________  RADIOLOGY  I personally reviewed all radiographic images ordered to evaluate for the above acute complaints and reviewed radiology reports and findings.  These findings were personally discussed with the patient.  Please see medical record for radiology report.  ____________________________________________    PROCEDURES  Procedure(s) performed:  Procedures    Critical Care performed: no ____________________________________________   INITIAL IMPRESSION / ASSESSMENT AND PLAN / ED COURSE  Pertinent labs & imaging results that were available during my care of the patient were reviewed by me and considered in my medical decision making (see chart for details).   DDX: Dehydration, sepsis, pna, uti, hypoglycemia, cva, drug effect, withdrawal, encephalitis   Donna Goodman is a 85 y.o. who presents to the ED with symptoms as described above.  Is low-grade oral hypothermia.  Otherwise well perfused.  Blood will be sent for the by differential.  Patient unable to provide much additional history.  No evidence of trauma.  Clinical Course as of Jun 26 1234  Thu Jun 26, 2018  1235 I discussed results and work-up with the patient's power of attorney, her brother.  We discussed results.  He states that she has advanced dementia and has not recognized him for 2 years and that confusion is basically her baseline.  No additional symptoms or concerns were relayed to him.  He was unaware that she was being sent to the ER.  He agrees with plan for discharge back to [facility].    [PR]    Clinical Course User Index [PR] Willy Eddyobinson, Erikka Follmer, MD    The patient was evaluated in Emergency Department today for the symptoms described in the history of present illness. He/she was evaluated in the context of the global COVID-19 pandemic, which necessitated consideration that the patient might be at risk for infection with the SARS-CoV-2 virus that causes COVID-19. Institutional protocols and algorithms that pertain to the evaluation of patients at risk for COVID-19 are in a state of rapid change based on information released by regulatory bodies including the CDC and federal and state organizations. These policies and algorithms were followed during the patient's care in the ED.  As part of my medical decision making,  I reviewed the following data within the electronic MEDICAL RECORD NUMBER Nursing notes reviewed and incorporated, Labs reviewed, notes from prior ED visits and [place] Controlled Substance Database   ____________________________________________   FINAL CLINICAL IMPRESSION(S) / ED DIAGNOSES  Final diagnoses:  Altered mental status, unspecified altered mental status type      NEW MEDICATIONS STARTED DURING THIS VISIT:  New Prescriptions   No medications on file     Note:  This document was prepared using Dragon voice recognition software and may include unintentional dictation errors.    Willy Eddyobinson, Mabelle Mungin, MD 06/26/18 [phone]

## 2018-06-26 NOTE — ED Notes (Signed)
Report given to [place] at [facility], pt to return to room 308

## 2018-06-26 NOTE — ED Notes (Signed)
Report given to [place] at [facility], per [place] pt has to come back by EMS, they are unable to come and pick her up

## 2018-06-26 NOTE — ED Notes (Signed)
Attempted to [facility], no answer, will try again

## 2018-06-27 LAB — URINE CULTURE
Culture: NO GROWTH
Special Requests: NORMAL

## 2018-07-01 LAB — CULTURE, BLOOD (SINGLE)
Culture: NO GROWTH
Special Requests: ADEQUATE

## 2018-07-07 ENCOUNTER — Emergency Department
Admission: EM | Admit: 2018-07-07 | Discharge: 2018-07-07 | Disposition: A | Discharge status: Skilled Nursing Facility | Payer: Medicare Other | Attending: Emergency Medicine | Admitting: Emergency Medicine

## 2018-07-07 ENCOUNTER — Encounter: Payer: Self-pay | Admitting: Emergency Medicine

## 2018-07-07 ENCOUNTER — Emergency Department: Payer: Medicare Other

## 2018-07-07 ENCOUNTER — Other Ambulatory Visit: Payer: Self-pay

## 2018-07-07 DIAGNOSIS — W19XXXA Unspecified fall, initial encounter: Secondary | ICD-10-CM | POA: Insufficient documentation

## 2018-07-07 DIAGNOSIS — F0390 Unspecified dementia without behavioral disturbance: Secondary | ICD-10-CM | POA: Diagnosis not present

## 2018-07-07 DIAGNOSIS — Y999 Unspecified external cause status: Secondary | ICD-10-CM | POA: Diagnosis not present

## 2018-07-07 DIAGNOSIS — J45909 Unspecified asthma, uncomplicated: Secondary | ICD-10-CM | POA: Diagnosis not present

## 2018-07-07 DIAGNOSIS — Z7982 Long term (current) use of aspirin: Secondary | ICD-10-CM | POA: Diagnosis not present

## 2018-07-07 DIAGNOSIS — S0990XA Unspecified injury of head, initial encounter: Secondary | ICD-10-CM

## 2018-07-07 DIAGNOSIS — Z79899 Other long term (current) drug therapy: Secondary | ICD-10-CM | POA: Insufficient documentation

## 2018-07-07 DIAGNOSIS — Y939 Activity, unspecified: Secondary | ICD-10-CM | POA: Diagnosis not present

## 2018-07-07 DIAGNOSIS — Y92199 Unspecified place in other specified residential institution as the place of occurrence of the external cause: Secondary | ICD-10-CM | POA: Insufficient documentation

## 2018-07-07 DIAGNOSIS — Z87891 Personal history of nicotine dependence: Secondary | ICD-10-CM | POA: Insufficient documentation

## 2018-07-07 DIAGNOSIS — S40211A Abrasion of right shoulder, initial encounter: Secondary | ICD-10-CM | POA: Diagnosis not present

## 2018-07-07 DIAGNOSIS — I10 Essential (primary) hypertension: Secondary | ICD-10-CM | POA: Diagnosis not present

## 2018-07-07 DIAGNOSIS — J449 Chronic obstructive pulmonary disease, unspecified: Secondary | ICD-10-CM | POA: Diagnosis not present

## 2018-07-07 DIAGNOSIS — S40219A Abrasion of unspecified shoulder, initial encounter: Secondary | ICD-10-CM

## 2018-07-07 LAB — GLUCOSE, CAPILLARY: Glucose-Capillary: 72 mg/dL (ref 70–99)

## 2018-07-07 NOTE — ED Provider Notes (Signed)
Donna Goodman was evaluated in Emergency Department on 07/07/2018 for the symptoms described in the history of present illness. She was evaluated in the context of the global COVID-19 pandemic, which necessitated consideration that the patient might be at risk for infection with the SARS-CoV-2 virus that causes COVID-19. Institutional protocols and algorithms that pertain to the evaluation of patients at risk for COVID-19 are in a state of rapid change based on information released by regulatory bodies including the CDC and federal and state organizations. These policies and algorithms were followed during the patient's care in the ED.     [facility] Emergency Department Provider Note   ____________________________________________   First MD Initiated Contact with Patient 07/07/18 0130     (approximate)  I have reviewed the triage vital signs and the nursing notes.   HISTORY  Chief Complaint Fall  EM caveat: Limited by severe dementia  HPI Donna Standingmma N Oliger is a 85 y.o. female with a history of dementia COPD, depression thyroid disease  Patient was found between her bed and dresser at her care facility.  I discussed with her brother Peyton NajjarLarry who reports he is her medical decision-maker and he reports she was evaluated a couple weeks ago at the ER and everything checked out okay but she has severe dementia.  She has been in her normal health and there is not been any report of recent illness.  She had an unwitnessed fall he reports staff reported to him also that she had fallen out of bed.  Patient denies any pain discomfort or injury.   In further discussion with the patient's family, Peyton NajjarLarry, we will provide a CT of the head to check for any injury from fall but he and I with shared medical decision making do not find indication for blood work or further evaluation other than evaluation for injury.  Past Medical History:  Diagnosis Date  . Anxiety    unspecified  .  Asthma   . Asthma without status asthmaticus    unspecified  . Cataract   . COPD (chronic obstructive pulmonary disease) (HCC)   . Dementia (HCC)   . Depression    unspecified  . Hyperlipemia    unspecified  . Hypertension   . Thyroid disease   . Visual impairment     Patient Active Problem List   Diagnosis Date Noted  . Hip fracture (HCC) 02/06/2017  . Multifocal pneumonia 01/29/2016  . SOB (shortness of breath) 01/29/2016  . Cough, persistent 01/29/2016  . COPD with acute exacerbation (HCC) 01/29/2016    Past Surgical History:  Procedure Laterality Date  . BACK SURGERY    . HIP PINNING,CANNULATED Right 02/07/2017   Procedure: CANNULATED HIP PINNING;  Surgeon: Lyndle HerrlichBowers, James R, MD;  Location: [facility] ORS;  Service: Orthopedics;  Laterality: Right;    Prior to Admission medications   Medication Sig Start Date End Date Taking? Authorizing Provider  acetaminophen (TYLENOL) 500 MG tablet Take 500 mg by mouth 3 (three) times daily.     [provider]  acetaminophen (TYLENOL) 500 MG tablet Take 500 mg by mouth every 4 (four) hours as needed for mild pain, fever or headache.     [provider]  alum & mag hydroxide-simeth (MAALOX/MYLANTA) 200-200-20 MG/5ML suspension Take 30 mLs by mouth 4 (four) times daily as needed for indigestion or heartburn.     [provider]  aspirin EC 81 MG tablet Take 81 mg by mouth daily.    [provider]  atorvastatin (LIPITOR) 20 MG tablet Take 20 mg by mouth at bedtime.    [provider]  cetirizine (ZYRTEC) 10 MG tablet Take 10 mg by mouth daily.    [provider]  diclofenac sodium (VOLTAREN) 1 % GEL Apply 2 g topically 2 (two) times a day.    [provider]  divalproex (DEPAKOTE) 125 MG DR tablet Take 125 mg by mouth 2 (two) times daily.    [provider]  Fluticasone-Salmeterol (WIXELA INHUB) 100-50 MCG/DOSE AEPB Inhale 1 puff into the lungs 2 (two) times daily.     [provider]  guaiFENesin (ROBITUSSIN) 100 MG/5ML liquid Take 200 mg by mouth every 6 (six) hours as needed for cough.    [provider]  ibuprofen (ADVIL) 400 MG tablet Take 400 mg by mouth daily as needed for moderate pain.    [provider]  levothyroxine (SYNTHROID) 300 MCG tablet Take 300 mcg by mouth daily before breakfast.    [provider]  liothyronine (CYTOMEL) 25 MCG tablet Take 25 mcg by mouth daily.    [provider]  loperamide (IMODIUM) 2 MG capsule Take 2 mg by mouth 3 (three) times daily as needed for diarrhea or loose stools.    [provider]  magnesium hydroxide (MILK OF MAGNESIA) 400 MG/5ML suspension Take 30 mLs by mouth at bedtime as needed for mild constipation.    [provider]  neomycin-bacitracin-polymyxin (NEOSPORIN) [phone] ointment Apply 1 application topically as needed (minor skin tears or abrasion).    [provider]  sertraline (ZOLOFT) 50 MG tablet Take 50 mg by mouth daily.     [provider]  topiramate (TOPAMAX) 25 MG tablet Take 25 mg by mouth at bedtime.     [provider]  valsartan (DIOVAN) 80 MG tablet Take 80 mg by mouth daily.    [provider]    Allergies Codeine  Family History  Problem Relation Age of Onset  . Coronary artery disease Mother   . Heart attack Mother   . Arthritis Other   . Cancer Other   . Cancer Father   . Cancer Sister   . Cancer Brother     Social History Social History   Tobacco Use  . Smoking status: Former Smoker    Packs/day: 0.50    Years: 65.00    Pack years: 32.50    Types: Cigarettes  . Smokeless tobacco: Never Used  Substance Use Topics  . Alcohol use: No    Comment: hx of beer and wine  . Drug use: No    Review of Systems Was evaluated a week or 2 ago in the ER for her ongoing confusion. Peyton Najjar reports that she has been in good health and is not heard of any concerns other than her  severe dementia and of course the fall this evening   ____________________________________________   PHYSICAL EXAM:  VITAL SIGNS: ED Triage Vitals [07/07/18 0131]  Enc Vitals Group     BP 120/71     Pulse      Resp      Temp 97.6 F (36.4 C)     Temp Source Oral     SpO2      Weight      Height      Head Circumference      Peak Flow      Pain Score      Pain Loc      Pain Edu?  Excl. in GC?     Constitutional: Alert and oriented to self but not to place or date or time. Well appearing and in no acute distress.  She is calm and does not show any evidence of psychomotor agitation. Eyes: Conjunctivae are normal. Head: Atraumatic. Nose: No congestion/rhinnorhea. Mouth/Throat: Mucous membranes are moist. Neck: No stridor.  Cardiovascular: Normal rate, regular rhythm. Grossly normal heart sounds.  Good peripheral circulation. Respiratory: Normal respiratory effort.  No retractions. Lungs CTAB. Gastrointestinal: Soft and nontender. No distention. Musculoskeletal: No lower extremity tenderness nor edema.  She moves all extremities well.  She has no injury except for small very superficial skin tear over the right shoulder but no swelling and she is able to range the extremity without pain or discomfort.  Normal examination of the upper and lower extremities except for the skin tear.  Full range of motion of the lower legs without pain.  No discomfort over the hips knees ankles or long bones including upper extremities as well. Neurologic:  Normal speech and language though she is extremely hard of hearing. No gross focal neurologic deficits are appreciated.  Skin:  Skin is warm, dry and intact. No rash noted. Psychiatric: Mood and affect are calm, "pleasantly confused"  With nursing staff assistance, patient logrolled.  No tenderness along with cervical thoracic or lumbar spine.  No step-offs or deformities.  No injuries lesions hematomas or lacerations to the back.  ____________________________________________   LABS (all labs ordered are listed, but only abnormal results are displayed)  Labs Reviewed  GLUCOSE, CAPILLARY  CBG MONITORING, ED   ____________________________________________  EKG  Reviewed entered by me at 148 Heart rate 70 QRS 90 QTc 470 Normal sinus rhythm, no evidence of ischemia or ectopy.  Some baseline wander.  Difficult to have patient sit still due to her dementia ____________________________________________  RADIOLOGY  Ct Head Wo Contrast  Result Date: 07/07/2018 CLINICAL DATA:  85 y/o F; Head trauma, intracranial venous injury suspected fall, unwitnessed. EXAM: CT HEAD WITHOUT CONTRAST TECHNIQUE: Contiguous axial images were obtained from the base of the skull through the vertex without intravenous contrast. COMPARISON:  04/26/2017 CT head FINDINGS: Brain: No evidence of acute infarction, hemorrhage, hydrocephalus, extra-axial collection or mass lesion/mass effect. Stable chronic microvascular ischemic changes and volume loss of the brain. Vascular: Calcific atherosclerosis of carotid siphons. No hyperdense vessel. Skull: Normal. Negative for fracture or focal lesion. Sinuses/Orbits: No acute finding. Other: None. IMPRESSION: 1. No acute intracranial abnormality or calvarial fracture identified. 2. Stable chronic microvascular ischemic changes and parenchymal volume loss of the brain. Electronically Signed   By: Mitzi Hansen M.D.   On: 07/07/2018 02:43     CT reviewed negative for acute ____________________________________________   PROCEDURES  Procedure(s) performed: None  Procedures  Critical Care performed: No  ____________________________________________   INITIAL IMPRESSION / ASSESSMENT AND PLAN / ED COURSE  Pertinent labs & imaging results that were available during my care of the patient were reviewed by me and considered in my medical decision making (see chart for details).   Unwitnessed  fall.  Likely mechanical in nature given the fall next to the bed.  No report of recent illness except for an ER evaluation a couple weeks ago.  Discussed with her decision-maker, wish to minimize evaluation except for evaluation for injury from fall.  Will provide CT of the head, bandaging to the small skin tear of the right upper arm.     The patient does not meet any criteria for coronavirus testing  at this time.  ----------------------------------------- 2:52 AM on 07/07/2018 -----------------------------------------  Resting comfortably in no distress.  Mom CT and EKG reassuring.  Glucose within normal limits.  At this point, no evidence of acute injury from her fall.  Will discharge back to [facility].  Patient appears stable for discharge. ____________________________________________   FINAL CLINICAL IMPRESSION(S) / ED DIAGNOSES  Final diagnoses:  Abrasion, shoulder w/o infection        Note:  This document was prepared using Dragon voice recognition software and may include unintentional dictation errors       Sharyn Creamer, MD 07/07/18 [phone]

## 2018-07-07 NOTE — ED Notes (Signed)
Dr Fanny Bien in to see pt; pt is very hard of hearing; when asked how she feels she said she feels alright; when asked if she's having any pain she said I don't think so; pt alert and oriented to person only; skin tear to the top of her right shoulder; no marks or bruising noted on MD inspection; no tenderness to her back;

## 2018-07-07 NOTE — ED Notes (Signed)
Attempted to call pt's Legal guardian, Titus Mould, that was listed as pt's legal guardian from her visit on 06/30/18; no answer or mailbox to leave message; Dr Fanny Bien has already spoken to pt's emergency contact listed on her paperwork from [facility]-(brother) Arthur Holms at [phone]

## 2018-07-07 NOTE — ED Triage Notes (Signed)
Pt here for unwitnessed fall from [facility]; pt with history of dementia; skin tear to the top of her right shoulder; MD on phone with family at this time;

## 2018-07-07 NOTE — ED Notes (Signed)
Patient left with EMS.

## 2018-07-07 NOTE — ED Notes (Signed)
EMS pt from  house following an unwitnessed fall; wedged between bed and dresser; skin tear to right shoulder, no bleeding; 154/90, 76HR,  94% room air; history of dementia;

## 2018-07-07 NOTE — ED Notes (Signed)
Patient's skin tear on right shoulder was cleaned and dressed with gauze and tegaderm. Patient tolerated procedure well.

## 2018-07-07 NOTE — ED Notes (Signed)
Patient is back from imaging. Warm blanket given. 

## 2018-07-07 NOTE — ED Notes (Signed)
Yellow bracelet and yellow socks applied. Patient taken to imaging.

## 2019-03-22 ENCOUNTER — Encounter: Payer: Self-pay | Admitting: Emergency Medicine

## 2019-03-22 ENCOUNTER — Emergency Department
Admission: EM | Admit: 2019-03-22 | Discharge: 2019-03-23 | Disposition: A | Discharge status: Home / Self Care | Payer: Medicare Other | Attending: Emergency Medicine | Admitting: Emergency Medicine

## 2019-03-22 ENCOUNTER — Other Ambulatory Visit: Payer: Self-pay

## 2019-03-22 DIAGNOSIS — Z87891 Personal history of nicotine dependence: Secondary | ICD-10-CM | POA: Diagnosis not present

## 2019-03-22 DIAGNOSIS — Z79899 Other long term (current) drug therapy: Secondary | ICD-10-CM | POA: Diagnosis not present

## 2019-03-22 DIAGNOSIS — Z791 Long term (current) use of non-steroidal anti-inflammatories (NSAID): Secondary | ICD-10-CM | POA: Diagnosis not present

## 2019-03-22 DIAGNOSIS — I10 Essential (primary) hypertension: Secondary | ICD-10-CM | POA: Insufficient documentation

## 2019-03-22 DIAGNOSIS — J45909 Unspecified asthma, uncomplicated: Secondary | ICD-10-CM | POA: Diagnosis not present

## 2019-03-22 DIAGNOSIS — R05 Cough: Secondary | ICD-10-CM | POA: Diagnosis present

## 2019-03-22 DIAGNOSIS — U071 COVID-19: Secondary | ICD-10-CM | POA: Insufficient documentation

## 2019-03-22 DIAGNOSIS — Z7982 Long term (current) use of aspirin: Secondary | ICD-10-CM | POA: Diagnosis not present

## 2019-03-22 DIAGNOSIS — J449 Chronic obstructive pulmonary disease, unspecified: Secondary | ICD-10-CM | POA: Insufficient documentation

## 2019-03-22 LAB — POC SARS CORONAVIRUS 2 AG: SARS Coronavirus 2 Ag: POSITIVE — AB

## 2019-03-22 NOTE — ED Provider Notes (Signed)
[facility] Emergency Department Provider Note   ____________________________________________    I have reviewed the triage vital signs and the nursing notes.   HISTORY  Chief Complaint Cough  Patient with dementia, unable to provide any history   HPI Donna Goodman is a 86 y.o. female who presents with cough.  Patient sent from [place] for rapid Covid testing.  No reports of shortness of breath.  No further history is available  Past Medical History:  Diagnosis Date  . Anxiety    unspecified  . Asthma   . Asthma without status asthmaticus    unspecified  . Cataract   . COPD (chronic obstructive pulmonary disease) ([place])   . Dementia ([place])   . Depression    unspecified  . Hyperlipemia    unspecified  . Hypertension   . Thyroid disease   . Visual impairment     Patient Active Problem List   Diagnosis Date Noted  . Hip fracture ([place]) 02/06/2017  . Multifocal pneumonia 01/29/2016  . SOB (shortness of breath) 01/29/2016  . Cough, persistent 01/29/2016  . COPD with acute exacerbation ([place]) 01/29/2016    Past Surgical History:  Procedure Laterality Date  . BACK SURGERY    . HIP PINNING,CANNULATED Right 02/07/2017   Procedure: CANNULATED HIP PINNING;  Surgeon: Lovell Sheehan, MD;  Location: [facility] ORS;  Service: Orthopedics;  Laterality: Right;    Prior to Admission medications   Medication Sig Start Date End Date Taking? Authorizing Provider  acetaminophen (TYLENOL) 500 MG tablet Take 500 mg by mouth 3 (three) times daily.     [provider]  acetaminophen (TYLENOL) 500 MG tablet Take 500 mg by mouth every 4 (four) hours as needed for mild pain, fever or headache.     [provider]  alum & mag hydroxide-simeth (MAALOX/MYLANTA) 200-200-20 MG/5ML suspension Take 30 mLs by mouth 4 (four) times daily as needed for indigestion or heartburn.     [provider]  aspirin EC 81 MG tablet Take 81 mg by mouth  daily.    [provider]  atorvastatin (LIPITOR) 20 MG tablet Take 20 mg by mouth at bedtime.    [provider]  cetirizine (ZYRTEC) 10 MG tablet Take 10 mg by mouth daily.    [provider]  diclofenac sodium (VOLTAREN) 1 % GEL Apply 2 g topically 2 (two) times a day.    [provider]  divalproex (DEPAKOTE) 125 MG DR tablet Take 125 mg by mouth 2 (two) times daily.    [provider]  Fluticasone-Salmeterol (WIXELA INHUB) 100-50 MCG/DOSE AEPB Inhale 1 puff into the lungs 2 (two) times daily.    [provider]  guaiFENesin (ROBITUSSIN) 100 MG/5ML liquid Take 200 mg by mouth every 6 (six) hours as needed for cough.    [provider]  ibuprofen (ADVIL) 400 MG tablet Take 400 mg by mouth daily as needed for moderate pain.    [provider]  levothyroxine (SYNTHROID) 300 MCG tablet Take 300 mcg by mouth daily before breakfast.    [provider]  liothyronine (CYTOMEL) 25 MCG tablet Take 25 mcg by mouth daily.    [provider]  loperamide (IMODIUM) 2 MG capsule Take 2 mg by mouth 3 (three) times daily as needed for diarrhea or loose stools.    [provider]  magnesium hydroxide (MILK OF MAGNESIA) 400 MG/5ML suspension Take 30 mLs by mouth at bedtime as needed for mild  constipation.    [provider]  neomycin-bacitracin-polymyxin (NEOSPORIN) [id number] ointment Apply 1 application topically as needed (minor skin tears or abrasion).    [provider]  sertraline (ZOLOFT) 50 MG tablet Take 50 mg by mouth daily.     [provider]  topiramate (TOPAMAX) 25 MG tablet Take 25 mg by mouth at bedtime.     [provider]  valsartan (DIOVAN) 80 MG tablet Take 80 mg by mouth daily.    [provider]     Allergies Codeine  Family History  Problem Relation Age of Onset  . Coronary artery disease Mother   . Heart attack Mother   . Arthritis Other   .  Cancer Other   . Cancer Father   . Cancer Sister   . Cancer Brother     Social History Social History   Tobacco Use  . Smoking status: Former Smoker    Packs/day: 0.50    Years: 65.00    Pack years: 32.50    Types: Cigarettes  . Smokeless tobacco: Never Used  Substance Use Topics  . Alcohol use: No    Comment: hx of beer and wine  . Drug use: No    Unable to obtain review of Systems due to dementia   Respiratory: Positive cough    ____________________________________________   PHYSICAL EXAM:  VITAL SIGNS: ED Triage Vitals  Enc Vitals Group     BP      Pulse      Resp      Temp      Temp src      SpO2      Weight      Height      Head Circumference      Peak Flow      Pain Score      Pain Loc      Pain Edu?      Excl. in GC?     Constitutional: Alert, disoriented Eyes: Conjunctivae are normal.  Head: Atraumatic. Nose: No congestion/rhinnorhea. Mouth/Throat: Mucous membranes are moist.    Cardiovascular: Normal rate, regular rhythm. Grossly normal heart sounds.  Good peripheral circulation. Respiratory: Normal respiratory effort.  No tachypnea no retractions.  Bibasilar mild rales Gastrointestinal: Soft and nontender. No distention.   Musculoskeletal: No lower extremity tenderness nor edema.  Warm and well perfused Neurologic: No gross focal neurologic deficits are appreciated.  Skin:  Skin is warm, dry and intact. No rash noted.   ____________________________________________   LABS (all labs ordered are listed, but only abnormal results are displayed)  Labs Reviewed  POC SARS CORONAVIRUS 2 AG - Abnormal; Notable for the following components:      Result Value   SARS Coronavirus 2 Ag POSITIVE (*)    All other components within normal limits  POC SARS CORONAVIRUS 2 AG -  ED   ____________________________________________  EKG  None ____________________________________________  RADIOLOGY  None  ____________________________________________   PROCEDURES  Procedure(s) performed: No  Procedures   Critical Care performed: No ____________________________________________   INITIAL IMPRESSION / ASSESSMENT AND PLAN / ED COURSE  Pertinent labs & imaging results that were available during my care of the patient were reviewed by me and considered in my medical decision making (see chart for details).  Patient is quite agitated and preventing nurses from obtaining vital signs.  She does not appear to have any increased work of breathing.  We will perform rapid swab  ----------------------------------------- 10:12 PM on 03/22/2019 -----------------------------------------  Rapid  Covid swab is positive.  Patient's vital signs are reassuring, oxygen saturation is above 95%, no indication for admission at this time, appropriate for discharge.    ____________________________________________   FINAL CLINICAL IMPRESSION(S) / ED DIAGNOSES  Final diagnoses:  COVID-19        Note:  This document was prepared using Dragon voice recognition software and may include unintentional dictation errors.   Jene Every, MD 03/22/19 2212

## 2019-03-22 NOTE — ED Notes (Addendum)
Donna Goodman, med tech was given report on pts current condition. Pt was cleared as stable and Donna Goodman had no further questions or concerns for this RN> [facility] informed that pt will be sent back to [facility]

## 2019-03-22 NOTE — ED Triage Notes (Signed)
Pt brought in by [facility] from [facility] for COVID testing. Per facility, there was a exposure to COIVD by staff on Friday. Pt was already tested for COVID at facility but results have not come back fast enough -per facility/hospice RN  Pt is in hospice care and is a DNR. RA O2 stat per EMS was 94-95%

## 2019-03-22 NOTE — Discharge Instructions (Addendum)
Patient had a rapid antigen test positive for COVID-19.  Her oxygen saturations and vital signs are reassuring, no indication for admission at this time

## 2019-03-22 NOTE — ED Notes (Addendum)
Pt non verbal and cries out to the touch. Pt in no pain at this time using the PAINAD scale

## 2019-03-23 NOTE — ED Notes (Signed)
Unable to obtain signature at d/c d/t pt's mental status.

## 2019-04-20 DEATH — deceased

## 2020-04-02 IMAGING — DX PORTABLE CHEST - 1 VIEW
1 series · 1 of 1 positions shown · non-contrast
Comparison: 02/08/2017

CLINICAL DATA: Altered mental status and weakness. History of COPD.

EXAM:
PORTABLE CHEST 1 VIEW

[chest ap]
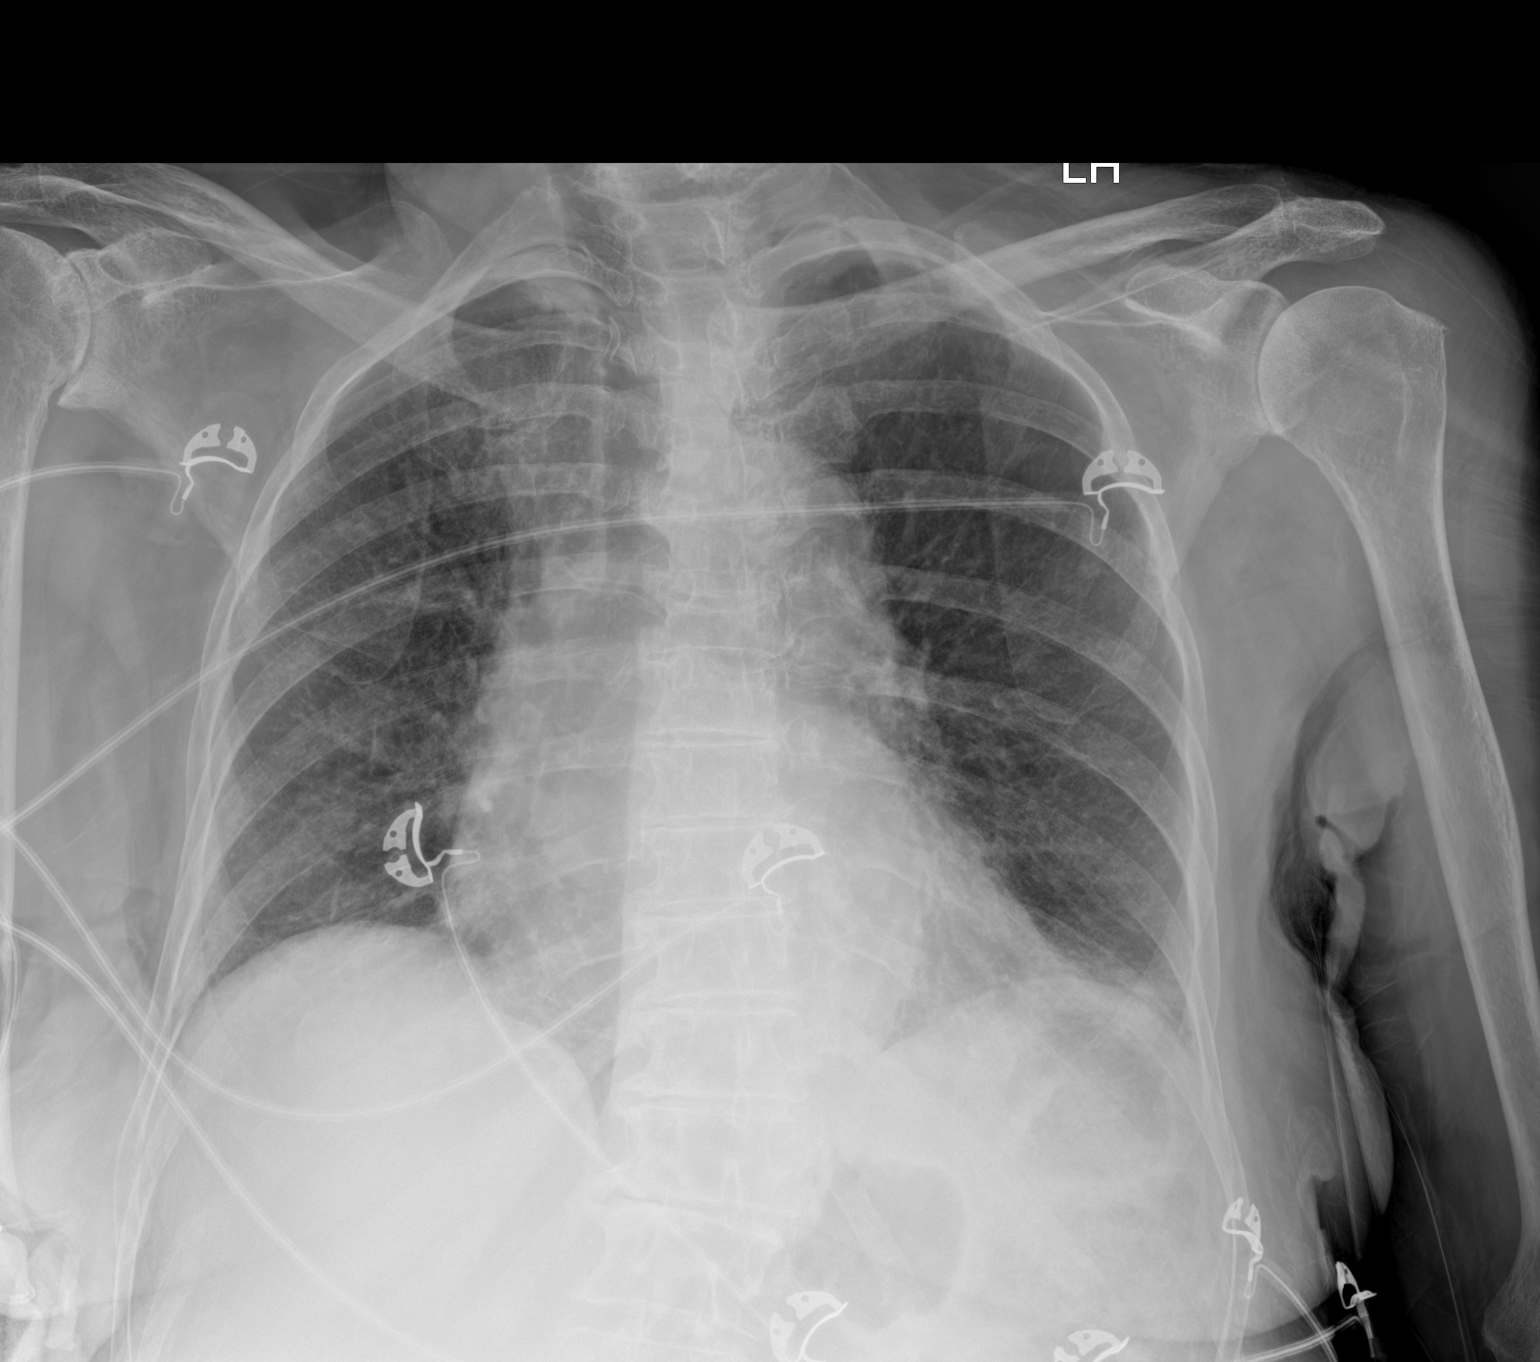

[1 of 1 positions shown; findings below may reference images not displayed]

FINDINGS: The patient is rotated to the right with grossly unchanged
cardiomediastinal silhouette. The lungs are less well inflated than
on the prior study with mild diffuse accentuation of the
interstitial markings and with mild atelectasis in the lung bases.
No segmental airspace consolidation, overt pulmonary edema, sizable
pleural effusion, pneumothorax is identified. There is a chronic
midthoracic compression fracture.
IMPRESSION: Hypoinflation with mild basilar atelectasis.
